# Patient Record
Sex: Male | Born: 1945
Health system: Southern US, Community
[De-identification: ages and names within clinical notes are randomized; demographics above are authoritative.]

## PROBLEM LIST (undated history)

## (undated) DIAGNOSIS — E785 Hyperlipidemia, unspecified: Secondary | ICD-10-CM

## (undated) DIAGNOSIS — K469 Unspecified abdominal hernia without obstruction or gangrene: Secondary | ICD-10-CM

## (undated) DIAGNOSIS — I2510 Atherosclerotic heart disease of native coronary artery without angina pectoris: Secondary | ICD-10-CM

## (undated) DIAGNOSIS — Z460 Encounter for fitting and adjustment of spectacles and contact lenses: Secondary | ICD-10-CM

## (undated) DIAGNOSIS — I700 Atherosclerosis of aorta: Secondary | ICD-10-CM

## (undated) HISTORY — PX: NASAL SINUS SURGERY: SHX719

## (undated) HISTORY — DX: Unspecified abdominal hernia without obstruction or gangrene: K46.9

## (undated) HISTORY — DX: Atherosclerosis of aorta: I70.0

## (undated) HISTORY — DX: Hyperlipidemia, unspecified: E78.5

## (undated) HISTORY — DX: Encounter for fitting and adjustment of spectacles and contact lenses: Z46.0

## (undated) HISTORY — PX: MOLE REMOVAL: SHX2046

## (undated) HISTORY — PX: OTHER SURGICAL HISTORY: SHX169

## (undated) HISTORY — DX: Atherosclerotic heart disease of native coronary artery without angina pectoris: I25.10

---

## 1998-10-07 ENCOUNTER — Ambulatory Visit (HOSPITAL_COMMUNITY): Admission: RE | Admit: 1998-10-07 | Discharge: 1998-10-07 | Payer: Self-pay | Admitting: *Deleted

## 1998-10-08 ENCOUNTER — Ambulatory Visit (HOSPITAL_COMMUNITY): Admission: RE | Admit: 1998-10-08 | Discharge: 1998-10-08 | Payer: Self-pay | Admitting: *Deleted

## 1999-10-08 ENCOUNTER — Emergency Department (HOSPITAL_COMMUNITY): Admission: EM | Admit: 1999-10-08 | Discharge: 1999-10-08 | Payer: Self-pay | Admitting: Emergency Medicine

## 1999-10-08 ENCOUNTER — Encounter: Payer: Self-pay | Admitting: Surgery

## 2003-03-25 ENCOUNTER — Encounter: Admission: RE | Admit: 2003-03-25 | Discharge: 2003-03-25 | Payer: Self-pay | Admitting: Diagnostic Radiology

## 2011-03-13 ENCOUNTER — Ambulatory Visit (INDEPENDENT_AMBULATORY_CARE_PROVIDER_SITE_OTHER): Payer: BC Managed Care – PPO | Admitting: General Surgery

## 2011-03-13 ENCOUNTER — Encounter (INDEPENDENT_AMBULATORY_CARE_PROVIDER_SITE_OTHER): Payer: Self-pay | Admitting: General Surgery

## 2011-03-13 VITALS — BP 134/72 | HR 70 | Temp 96.4°F | Ht 73.5 in | Wt 186.4 lb

## 2011-03-13 DIAGNOSIS — K4020 Bilateral inguinal hernia, without obstruction or gangrene, not specified as recurrent: Secondary | ICD-10-CM

## 2011-03-13 NOTE — Patient Instructions (Signed)
You have a large, reducible right inguinal hernia, and a very small left inguinal hernia. You have a small left hydrocele. We have discussed several different ways to handle these problems. We have decided to schedule you for laparoscopic repair of your bilateral inguinal hernias using mesh. We will delay repair of the left hydrocele indefinitely, unless it becomes symptomatic.

## 2011-03-13 NOTE — Progress Notes (Signed)
Subjective:     Patient ID: Mitchell Villegas, male   DOB: 1946-07-02, 65 y.o.   MRN: [id number]  HPI This patient is the husband of Mitchell Villegas, a former nurse in our clinic. He is self referred for evaluation of a large right inguinal hernia. He thinks he has a small left renal hernia and a small left hydrocele.  He states that he has had a bulge in his right groin for 5 years. It is been getting larger and is somewhat painful. He's had a couple of episodes where he had forcibly pushed it back in but he has never up developing abdominal pain or nausea. He went to [facility] family practice and I told him that he also might have a small left renal hernia.  He has a small left hydrocele which has been evaluated by one of the urologists in the past. This has remained asymptomatic. He wonders if this needs to be repaired at the same time.  Past Medical History  Diagnosis Date  . Hernia   . Hyperlipidemia   . Contact lens/glasses fitting     Current Outpatient Prescriptions  Medication Sig Dispense Refill  . aspirin 81 MG tablet Take 81 mg by mouth daily.        Marland Kitchen atorvastatin (LIPITOR) 40 MG tablet Take 40 mg by mouth daily.        . Multiple Vitamin (MULTI-VITAMIN PO) Take by mouth daily.         Allergies  Allergen Reactions  . Penicillins Anaphylaxis and Hives    Family History  Problem Relation Age of Onset  . Diabetes Mother   . Heart disease Father     heart attack  . Heart disease Brother     heart attack    History  Substance Use Topics  . Smoking status: Current Everyday Smoker  . Smokeless tobacco: Not on file  . Alcohol Use: Yes     1 or 2 beers per month      Review of Systems  Constitutional: Negative.   HENT: Negative.   Eyes: Negative.   Respiratory: Negative.   Cardiovascular: Negative.   Gastrointestinal: Negative.   Genitourinary: Positive for scrotal swelling. Negative for dysuria, urgency, frequency, hematuria, flank pain, decreased urine volume,  discharge, penile swelling, enuresis, difficulty urinating, genital sores, penile pain and testicular pain.  Neurological: Negative.   Hematological: Negative.   Psychiatric/Behavioral: Negative.        Objective:   Physical Exam  Constitutional: He is oriented to person, place, and time. He appears well-developed and well-nourished. No distress.  HENT:  Head: Normocephalic and atraumatic.  Nose: Nose normal.  Mouth/Throat: No oropharyngeal exudate.  Eyes: Conjunctivae and EOM are normal. Pupils are equal, round, and reactive to light. No scleral icterus.  Neck: Normal range of motion. Neck supple. No JVD present. No tracheal deviation present. No thyromegaly present.  Cardiovascular: Normal rate, regular rhythm and normal heart sounds.   No murmur heard. Pulmonary/Chest: Effort normal and breath sounds normal. No respiratory distress. He has no wheezes. He has no rales. He exhibits no tenderness.  Abdominal: Soft. Bowel sounds are normal. He exhibits no distension and no mass. There is no tenderness. There is no rebound and no guarding.  Genitourinary: Penis normal.    No penile tenderness.  Musculoskeletal: Normal range of motion. He exhibits no edema and no tenderness.  Lymphadenopathy:    He has no cervical adenopathy.  Neurological: He is alert and oriented to person, place, and  time. He has normal reflexes. No cranial nerve deficit. He exhibits normal muscle tone. Coordination normal.  Skin: Skin is dry. No rash noted. No erythema. No pallor.  Psychiatric: He has a normal mood and affect. His behavior is normal. Judgment and thought content normal.       Assessment:      Large, reducible right inguinal hernia. This looks more like a direct hernia.   small left inguinal hernia.  Small, asymptomatic left hydrocele.  Hyperlipidemia.  Tobacco use daily.    Plan:     Talked about different approaches. He is strongly motivated towards laparoscopic repair. For this  reason we are going to go ahead and schedule him for a laparoscopic repair of his bilateral inguinal hernias. We are not going to attempt to repair the hydrocele laparoscopically. We will repair that in the future if he becomes symptomatic.  I discussed the indications and details of surgery with him. Risks and complications have been outlined, including but limited to bleeding, infection, conversion to open surgery, injury to adjacent organs such as the bladder,  intestine with major reconstructive surgery, nerve damage with chronic pain, recurrence of the hernia, cardiac pulmonary and thromboembolic problems. He seems to understand these issues well. At this time all of his questions were answered. He is in full agreement with this plan.

## 2011-04-10 HISTORY — PX: HERNIA REPAIR: SHX51

## 2011-05-29 ENCOUNTER — Other Ambulatory Visit (INDEPENDENT_AMBULATORY_CARE_PROVIDER_SITE_OTHER): Payer: Self-pay | Admitting: General Surgery

## 2011-05-29 ENCOUNTER — Telehealth (INDEPENDENT_AMBULATORY_CARE_PROVIDER_SITE_OTHER): Payer: Self-pay

## 2011-05-29 ENCOUNTER — Encounter (HOSPITAL_COMMUNITY): Payer: BC Managed Care – PPO

## 2011-05-29 ENCOUNTER — Ambulatory Visit (HOSPITAL_COMMUNITY)
Admission: RE | Admit: 2011-05-29 | Discharge: 2011-05-29 | Disposition: A | Discharge status: Home / Self Care | Payer: BC Managed Care – PPO | Source: Ambulatory Visit | Attending: General Surgery | Admitting: General Surgery

## 2011-05-29 DIAGNOSIS — Z01810 Encounter for preprocedural cardiovascular examination: Secondary | ICD-10-CM | POA: Insufficient documentation

## 2011-05-29 DIAGNOSIS — R05 Cough: Secondary | ICD-10-CM | POA: Insufficient documentation

## 2011-05-29 DIAGNOSIS — Z01818 Encounter for other preprocedural examination: Secondary | ICD-10-CM | POA: Insufficient documentation

## 2011-05-29 DIAGNOSIS — R059 Cough, unspecified: Secondary | ICD-10-CM | POA: Insufficient documentation

## 2011-05-29 DIAGNOSIS — Z01812 Encounter for preprocedural laboratory examination: Secondary | ICD-10-CM | POA: Insufficient documentation

## 2011-05-29 DIAGNOSIS — Z87891 Personal history of nicotine dependence: Secondary | ICD-10-CM | POA: Insufficient documentation

## 2011-05-29 LAB — DIFFERENTIAL
Basophils Absolute: 0.1 10*3/uL (ref 0.0–0.1)
Basophils Relative: 1 % (ref 0–1)
Eosinophils Absolute: 0.3 10*3/uL (ref 0.0–0.7)
Eosinophils Relative: 4 % (ref 0–5)
Lymphocytes Relative: 35 % (ref 12–46)
Lymphs Abs: 2.9 10*3/uL (ref 0.7–4.0)
Monocytes Absolute: 0.6 10*3/uL (ref 0.1–1.0)
Monocytes Relative: 8 % (ref 3–12)
Neutro Abs: 4.3 10*3/uL (ref 1.7–7.7)
Neutrophils Relative %: 53 % (ref 43–77)

## 2011-05-29 LAB — SURGICAL PCR SCREEN
MRSA, PCR: NEGATIVE
Staphylococcus aureus: NEGATIVE

## 2011-05-29 LAB — COMPREHENSIVE METABOLIC PANEL
AST: 19 U/L (ref 0–37)
Albumin: 4.1 g/dL (ref 3.5–5.2)
Alkaline Phosphatase: 53 U/L (ref 39–117)
Chloride: 100 mEq/L (ref 96–112)
Potassium: 3.9 mEq/L (ref 3.5–5.1)
Sodium: 138 mEq/L (ref 135–145)
Total Bilirubin: 0.3 mg/dL (ref 0.3–1.2)
Total Protein: 7.6 g/dL (ref 6.0–8.3)

## 2011-05-29 LAB — CBC
HCT: 47.2 % (ref 39.0–52.0)
Hemoglobin: 16.1 g/dL (ref 13.0–17.0)
MCH: 30.7 pg (ref 26.0–34.0)
MCHC: 34.1 g/dL (ref 30.0–36.0)
MCV: 89.9 fL (ref 78.0–100.0)

## 2011-05-29 LAB — URINALYSIS, ROUTINE W REFLEX MICROSCOPIC
Bilirubin Urine: NEGATIVE
Glucose, UA: NEGATIVE mg/dL
Hgb urine dipstick: NEGATIVE
Ketones, ur: NEGATIVE mg/dL
Protein, ur: NEGATIVE mg/dL

## 2011-05-29 NOTE — Telephone Encounter (Signed)
Received msg from Pat at pre admit re:pt allergy to penicillin. Pre op antibiotic changed to vancomycin and faxed back to [facility] per Dr Jacinto Halim order.

## 2011-06-02 ENCOUNTER — Ambulatory Visit (HOSPITAL_COMMUNITY)
Admission: RE | Admit: 2011-06-02 | Discharge: 2011-06-02 | Disposition: A | Discharge status: Home / Self Care | Payer: BC Managed Care – PPO | Source: Ambulatory Visit | Attending: General Surgery | Admitting: General Surgery

## 2011-06-02 ENCOUNTER — Telehealth (INDEPENDENT_AMBULATORY_CARE_PROVIDER_SITE_OTHER): Payer: Self-pay | Admitting: General Surgery

## 2011-06-02 DIAGNOSIS — F17200 Nicotine dependence, unspecified, uncomplicated: Secondary | ICD-10-CM | POA: Insufficient documentation

## 2011-06-02 DIAGNOSIS — K4020 Bilateral inguinal hernia, without obstruction or gangrene, not specified as recurrent: Secondary | ICD-10-CM

## 2011-06-02 DIAGNOSIS — Z01818 Encounter for other preprocedural examination: Secondary | ICD-10-CM | POA: Insufficient documentation

## 2011-06-02 DIAGNOSIS — R05 Cough: Secondary | ICD-10-CM | POA: Insufficient documentation

## 2011-06-02 DIAGNOSIS — Z01812 Encounter for preprocedural laboratory examination: Secondary | ICD-10-CM | POA: Insufficient documentation

## 2011-06-02 DIAGNOSIS — Z01810 Encounter for preprocedural cardiovascular examination: Secondary | ICD-10-CM | POA: Insufficient documentation

## 2011-06-02 DIAGNOSIS — R059 Cough, unspecified: Secondary | ICD-10-CM | POA: Insufficient documentation

## 2011-06-02 NOTE — Telephone Encounter (Signed)
Patient needs a 2wk po after sx on 06/02/11, prefers any day after 11:30am if possible, please call.

## 2011-06-02 NOTE — Telephone Encounter (Signed)
Pt home doing well. Po appt made. Pt to call with concerns.

## 2011-06-04 NOTE — Op Note (Signed)
NAMEMarland Kitchen  GEROD, CALIGIURI NO.:  [id number]  MEDICAL RECORD NO.:  [id number]  LOCATION:  DAYL                         FACILITY:  [facility] [place]  PHYSICIAN:  Angelia Mould. Derrell Lolling, M.D.DATE OF BIRTH:  1945-10-04  DATE OF PROCEDURE: DATE OF DISCHARGE:                              OPERATIVE REPORT   PREOPERATIVE DIAGNOSIS:  Bilateral inguinal hernias.  POSTOPERATIVE DIAGNOSIS:  Bilateral inguinal hernias.  OPERATION PERFORMED:  Laparoscopic, preperitoneal repair of bilateral inguinal hernias with Ultrapro mesh.  SURGEON:  Angelia Mould. Derrell Lolling, M.D.  OPERATIVE INDICATION:  This is a 65 year old gentleman in reasonably good health.  He has had a bulge in his right groin for about 5 years. It is getting larger and becoming painful.  He has to forcibly push this back in a couple of times.  On exam, he has a large right inguinal hernia, which is reducible and a small left inguinal hernia.  We discussed options for intervention and he was strongly motivated towards having a laparoscopic repair.  He was brought to the operating room electively.  OPERATIVE FINDINGS:  The patient had a large direct right inguinal hernia and a smaller direct left inguinal hernia.  I do not think he had any evidence of indirect hernia on either side.  OPERATIVE TECHNIQUE:  Following the induction of general endotracheal anesthesia, a Foley catheter was inserted, the bladder was emptied, the balloon was deflated, and the Foley left in place.  The abdomen and genitalia were prepped and draped in a sterile fashion.  Intravenous antibiotics were given.  The patient was identified as correct patient, correct procedure, and correct site with a surgical time-out.  Marcaine 0.5% with epinephrine was used as a local infiltration anesthetic.  A transverse incision was made just below the umbilicus.  The fascia was incised transversely exposing the right rectus muscle.  The Spacemaker balloon was inserted in the  right rectus sheath down to just above the symphysis pubis.  Video camera was inserted.  The dissector balloon was inflated with air under direct vision and held in place for 5 minutes. We had good visualization of the rectus muscles anteriorly, preperitoneal fat posteriorly, symphysis pubis, and Cooper's ligaments inferiorly.  This camera was removed.  The balloon was deflated and removed.  The trocar balloon was inflated and secured.  The trocar was then connected to the insufflator at 13 mmHg.  We re-expanded the preperitoneal space and reinserted the camera.  We had good visualization and minimal bleeding.  A 5-mm trocar was placed in the midline just below the umbilicus.  I used this to clean off the fascia on the right and left side, pulling the peritoneum down from the transversus abdominis laterally.  The inferior epigastric vessels on the right side had been pulled down by the balloon and were interfering with visualization and so they were clipped and divided.  We then completed the dissection of the peritoneum down off the transversus abdominis muscles bilaterally.  We placed 5-mm trocars on the right lower quadrant and left lower quadrant.  I then dissected the right side, dissecting the large direct hernia.  I dissected the pseudosac away from the preperitoneal fat and allowed it to  retract.  I actually pulled it back out and tacked it to the superior rim of the defect with a secure strap device to hoping to reduce seroma formation.  We then dissected the cord structures on the right side. There were some peritoneum up on the cord, but actually not going into the internal ring.  We dissected that back.  We identified the vas deferens and testicular vessels.  I felt the dissection was completed on the right side.  I then dissected the left side in similar fashion. There was a small direct hernia on this side, but had no incarcerated contents.  I then dissected the cord  structures on the left side, cleaning them off somewhat to be sure there was no indirect hernia and there was not.  I repaired the hernias bilaterally with a 3 x 6 inch piece of Ultrapro mesh on each side.  The meshes were placed so as to slightly overlap in the midline, then were secured up the midline with the secure strap. The meshes were positioned so as to overlap Cooper's ligament, and I placed about 3 secure strap fixation devices above the Cooper's ligament on each side.  We then completed the mesh fixation laterally, being sure to palpate the tacker through the abdominal wall to be sure that we stayed above the iliopubic tract.  A couple of other tacks were placed along the superior rim of the mesh.  Inspection of the repair looked good.  The mesh was deployed well laterally and covered the defects quite nicely in the usual fashion.  There was no bleeding.  There was somewhat of a pneumoperitoneum itself placed the Veress needle in the right upper quadrant and evacuated the pneumoperitoneum.  At this point, we disconnected the insufflation and removed all the trocars.  We closed the fascia at the umbilicus with 2 interrupted figure-of-eight sutures of 0 Vicryl.  The skin incisions were closed with subcuticular sutures of 4-0 Monocryl and Dermabond.  The patient tolerated the procedure well and was taken to recovery room in stable condition.  ESTIMATED BLOOD LOSS:  About 10-20 cc.  COMPLICATIONS:  None.  Sponge, needle, and instrument counts were correct.     Angelia Mould. Derrell Lolling, M.D.     HMI/MEDQ  D:  06/02/2011  T:  06/02/2011  Job:  [id number]  cc:   Tammy R. Collins [place], M.D. Fax: [phone]  Electronically Signed by Claud Kelp M.D. on 06/04/2011 08:17:40 PM

## 2011-06-16 ENCOUNTER — Ambulatory Visit (INDEPENDENT_AMBULATORY_CARE_PROVIDER_SITE_OTHER): Payer: BC Managed Care – PPO | Admitting: General Surgery

## 2011-06-16 ENCOUNTER — Encounter (INDEPENDENT_AMBULATORY_CARE_PROVIDER_SITE_OTHER): Payer: BC Managed Care – PPO | Admitting: General Surgery

## 2011-06-16 ENCOUNTER — Encounter (INDEPENDENT_AMBULATORY_CARE_PROVIDER_SITE_OTHER): Payer: Self-pay | Admitting: General Surgery

## 2011-06-16 VITALS — BP 136/72 | HR 64 | Temp 96.8°F | Resp 14 | Ht 74.0 in | Wt 184.4 lb

## 2011-06-16 DIAGNOSIS — Z9889 Other specified postprocedural states: Secondary | ICD-10-CM

## 2011-06-16 NOTE — Progress Notes (Signed)
Subjective:     Patient ID: Hinda Glatter, male   DOB: [birth year], 65 y.o.   MRN: [id number]  HPI  Andersen underwent laparoscopic repair of bilateral inguinal hernias with mesh on November 2. He had minimal bruising but he was sore for about a week. That has now resolved and he feels well without any problems.Review of Systems     Objective:   Physical Exam Patient looks well and is in no distress.  The umbilical incisions, the trocar incisions, and both groins looked normal. There is no sign of any infection. There is no seroma or hematoma.The scrotum and testes are normal. He does have a spermatocele in the left scrotum as we knew about.    Assessment:     Bilateral inguinal hernias, uneventful recovery following laparoscopic repair with mesh.    Plan:     He is advised to take daily walks but to restrict himself to less than 20 pounds lifting and no sports until after December 2.  Return to see me p.r.n.

## 2011-06-16 NOTE — Patient Instructions (Signed)
Your wounds are healing very well. I advise you to restrict yourself to less than 20 pounds lifting, no sports or strenuous activities until after December 2. No restrictions thereafter. Please take a nice walk  every day. Return to see me if there are further problems.

## 2015-11-15 DIAGNOSIS — Z79899 Other long term (current) drug therapy: Secondary | ICD-10-CM | POA: Diagnosis not present

## 2016-01-05 DIAGNOSIS — R002 Palpitations: Secondary | ICD-10-CM | POA: Diagnosis not present

## 2016-01-05 DIAGNOSIS — J019 Acute sinusitis, unspecified: Secondary | ICD-10-CM | POA: Diagnosis not present

## 2016-01-22 ENCOUNTER — Emergency Department (HOSPITAL_COMMUNITY)
Admission: EM | Admit: 2016-01-22 | Discharge: 2016-01-22 | Disposition: A | Discharge status: Home / Self Care | Payer: Medicare Other | Attending: Emergency Medicine | Admitting: Emergency Medicine

## 2016-01-22 ENCOUNTER — Emergency Department (HOSPITAL_COMMUNITY): Payer: Medicare Other

## 2016-01-22 ENCOUNTER — Encounter (HOSPITAL_COMMUNITY): Payer: Self-pay | Admitting: Emergency Medicine

## 2016-01-22 ENCOUNTER — Other Ambulatory Visit: Payer: Self-pay

## 2016-01-22 DIAGNOSIS — E785 Hyperlipidemia, unspecified: Secondary | ICD-10-CM | POA: Diagnosis not present

## 2016-01-22 DIAGNOSIS — R002 Palpitations: Secondary | ICD-10-CM | POA: Diagnosis not present

## 2016-01-22 DIAGNOSIS — Z7982 Long term (current) use of aspirin: Secondary | ICD-10-CM | POA: Insufficient documentation

## 2016-01-22 DIAGNOSIS — F17200 Nicotine dependence, unspecified, uncomplicated: Secondary | ICD-10-CM | POA: Diagnosis not present

## 2016-01-22 DIAGNOSIS — Z79899 Other long term (current) drug therapy: Secondary | ICD-10-CM | POA: Diagnosis not present

## 2016-01-22 LAB — BASIC METABOLIC PANEL
Anion gap: 7 (ref 5–15)
BUN: 16 mg/dL (ref 6–20)
CO2: 26 mmol/L (ref 22–32)
Calcium: 9.3 mg/dL (ref 8.9–10.3)
Chloride: 105 mmol/L (ref 101–111)
Creatinine, Ser: 0.86 mg/dL (ref 0.61–1.24)
GFR calc Af Amer: >60 mL/min (ref >60)
GFR calc non Af Amer: >60 mL/min (ref >60)
Glucose, Bld: 99 mg/dL (ref 65–99)
Potassium: 3.9 mmol/L (ref 3.5–5.1)
Sodium: 138 mmol/L (ref 135–145)

## 2016-01-22 LAB — CBC
HCT: 46.6 % (ref 39.0–52.0)
Hemoglobin: 15.6 g/dL (ref 13.0–17.0)
MCH: 30.1 pg (ref 26.0–34.0)
MCHC: 33.5 g/dL (ref 30.0–36.0)
MCV: 89.8 fL (ref 78.0–100.0)
Platelets: 205 10*3/uL (ref 150–400)
RBC: 5.19 MIL/uL (ref 4.22–5.81)
RDW: 13.1 % (ref 11.5–15.5)
WBC: 8.4 10*3/uL (ref 4.0–10.5)

## 2016-01-22 LAB — I-STAT TROPONIN, ED: Troponin i, poc: 0 ng/mL (ref 0.00–0.08)

## 2016-01-22 NOTE — ED Notes (Signed)
Pt. Stated, I can feel my heart off and on heart beating fast. It started after i finished working outside.

## 2016-01-22 NOTE — Discharge Instructions (Signed)

## 2016-01-22 NOTE — ED Provider Notes (Signed)
CSN: [id number]     Arrival date & time 01/22/16  1442 History   First MD Initiated Contact with Patient 01/22/16 1610     Chief Complaint  Patient presents with  . Palpitations     (Consider location/radiation/quality/duration/timing/severity/associated sxs/prior Treatment) HPI   70 year old male with palpitations. These have been intermittent for the past several months. They seem to come on after a period of exertion. He will do things such as yard work and feel fine. Shortly after stopping though he will have episodes where he feels like his heart is racing and beating irregular. His previously sought evaluation for this, but symptoms resolve by the time he was able to see anybody. No specific dysrhythmia has been noted at this point. He currently has no complaints. He denies any other associated symptoms with these palpitations as chest pain or shortness of breath. Denies any systemic caffeine usage. No recent medication changes. Denies any over-the-counter medicines or supplements that are new either.  Past Medical History  Diagnosis Date  . Hernia   . Hyperlipidemia   . Contact lens/glasses fitting    Past Surgical History  Procedure Laterality Date  . Arm surgery      Stitches for wound (cut) two places. 8 to 10 years ago.  . Mole removal      about 12 years ago  . Hernia repair  04/10/11    bilateral inguinal   Family History  Problem Relation Age of Onset  . Diabetes Mother   . Heart disease Father     heart attack  . Heart disease Brother     heart attack   Social History  Substance Use Topics  . Smoking status: Current Every Day Smoker  . Smokeless tobacco: Never Used  . Alcohol Use: Yes     Comment: 1 or 2 beers per month    Review of Systems  All systems reviewed and negative, other than as noted in HPI.   Allergies  Penicillins  Home Medications   Prior to Admission medications   Medication Sig Start Date End Date Taking? Authorizing Provider   aspirin 81 MG tablet Take 324 mg by mouth daily.    Yes Historical Provider, MD  atorvastatin (LIPITOR) 40 MG tablet Take 40 mg by mouth daily.     Yes Historical Provider, MD  loratadine (CLARITIN) 10 MG tablet Take 10 mg by mouth daily.   Yes Historical Provider, MD  Multiple Vitamin (MULTI-VITAMIN PO) Take by mouth daily.     Yes Historical Provider, MD  acetaminophen (TYLENOL) 500 MG chewable tablet Chew 500 mg by mouth as needed.      Historical Provider, MD  HYDROcodone-acetaminophen (NORCO) 5-325 MG per tablet Take 1 tablet by mouth as needed.      Historical Provider, MD   BP 130/66 mmHg  Pulse 67  Temp(Src) 98.2 F (36.8 C)  Resp 14  Ht 6\' 2"  (1.88 m)  Wt 178 lb (80.74 kg)  BMI 22.84 kg/m2  SpO2 97% Physical Exam  Constitutional: He appears well-developed and well-nourished. No distress.  HENT:  Head: Normocephalic and atraumatic.  Eyes: Conjunctivae are normal. Right eye exhibits no discharge. Left eye exhibits no discharge.  Neck: Neck supple.  Cardiovascular: Normal rate, regular rhythm and normal heart sounds.  Exam reveals no gallop and no friction rub.   No murmur heard. Pulmonary/Chest: Effort normal and breath sounds normal. No respiratory distress.  Abdominal: Soft. He exhibits no distension. There is no tenderness.  Musculoskeletal: He exhibits  no edema or tenderness.  Neurological: He is alert.  Skin: Skin is warm and dry.  Psychiatric: He has a normal mood and affect. His behavior is normal. Thought content normal.  Nursing note and vitals reviewed.   ED Course  Procedures (including critical care time) Labs Review Labs Reviewed  BASIC METABOLIC PANEL  CBC  I-STAT TROPOININ, ED    Imaging Review Dg Chest 2 View  01/22/2016  CLINICAL DATA:  Palpitations EXAM: CHEST  2 VIEW COMPARISON:  05/29/2011 chest radiograph. FINDINGS: Stable cardiomediastinal silhouette with normal heart size. No pneumothorax. No pleural effusion. Lungs appear clear, with no  acute consolidative airspace disease and no pulmonary edema. IMPRESSION: No active cardiopulmonary disease. Electronically Signed   By: Delbert PhenixJason A Poff M.D.   On: 01/22/2016 15:18   I have personally reviewed and evaluated these images and lab results as part of my medical decision-making.   EKG Interpretation   Date/Time:  Saturday January 22 2016 14:45:40 EDT Ventricular Rate:  93 PR Interval:  156 QRS Duration: 94 QT Interval:  362 QTC Calculation: 450 R Axis:   87 Text Interpretation:  Normal sinus rhythm Normal ECG Confirmed by Juleen ChinaKOHUT   MD, Kaimani Clayson ([id number]) on 01/22/2016 4:10:36 PM      MDM   Final diagnoses:  Heart palpitations    70 year old male with palpitations. These have been ongoing and intermittent since the beginning of the year. By the time he has sought evaluation and he has been asymptomatic and underlying dysrhythmia, if any, has not been identified. Currently has no complaints. His EKG shows a sinus rhythm. He is hemodynamically stable. Basic labs unremarkable. He reports upcoming cardiology appointment. May benefit from extended monitoring.    Raeford RazorStephen Rogelio Waynick, MD 01/27/16 1236

## 2016-01-22 NOTE — ED Notes (Signed)
MD at bedside.Kohut  

## 2016-01-28 ENCOUNTER — Ambulatory Visit (INDEPENDENT_AMBULATORY_CARE_PROVIDER_SITE_OTHER): Payer: Medicare Other | Admitting: Cardiovascular Disease

## 2016-01-28 ENCOUNTER — Encounter: Payer: Self-pay | Admitting: Cardiovascular Disease

## 2016-01-28 DIAGNOSIS — R002 Palpitations: Secondary | ICD-10-CM | POA: Diagnosis not present

## 2016-01-28 DIAGNOSIS — E785 Hyperlipidemia, unspecified: Secondary | ICD-10-CM | POA: Insufficient documentation

## 2016-01-28 NOTE — Patient Instructions (Signed)
Medication Instructions:  Your physician recommends that you continue on your current medications as directed. Please refer to the Current Medication list given to you today.   Labwork: NONE  Testing/Procedures: Your physician has requested that you have an echocardiogram. Echocardiography is a painless test that uses sound waves to create images of your heart. It provides your doctor with information about the size and shape of your heart and how well your heart's chambers and valves are working. This procedure takes approximately one hour. There are no restrictions for this procedure.  Your physician has requested that you have en exercise stress myoview. For further information please visit [email or web address]. Please follow instruction sheet, as given.  Your physician has recommended that you wear an event monitor. Event monitors are medical devices that record the heart's electrical activity. Doctors most often us these monitors to diagnose arrhythmias. Arrhythmias are problems with the speed or rhythm of the heartbeat. The monitor is a small, portable device. You can wear one while you do your normal daily activities. This is usually used to diagnose what is causing palpitations/syncope (passing out).  30 DAY EVENT. PATIENT WILL HAVE THIS PLACED IN July AFTER VACATION. WILL NEED APPT TO DO SO.    Follow-Up: Your physician recommends that you schedule a follow-up appointment in: 2 MONTHS WITH DR Allyson SabalBERRY.   Exercise Stress Echocardiogram An exercise stress echocardiogram is a heart (cardiac) test used to check the function of your heart. This test may also be called an exercise stress echocardiography or stress echo. This stress test will check how well your heart muscle and valves are working and determine if your heart muscle is getting enough blood. You will exercise on a treadmill to naturally increase or stress the functioning of your heart.  An echocardiogram uses sound waves  (ultrasound) to produce an image of your heart. If your heart does not work normally, it may indicate coronary artery disease with poor coronary blood supply. The coronary arteries are the arteries that bring blood and oxygen to your heart. LET [facility] PROVIDER KNOW ABOUT:  Any allergies you have.  All medicines you are taking, including vitamins, herbs, eye drops, creams, and over-the-counter medicines.  Previous problems you or members of your family have had with the use of anesthetics.  Any blood disorders you have.  Previous surgeries you have had.  Medical conditions you have.  Possibility of pregnancy, if this applies. RISKS AND COMPLICATIONS Generally, this is a safe procedure. However, as with any procedure, complications can occur. Possible complications can include:  You develop pain or pressure in the following areas:  Chest.  Jaw or neck.  Between your shoulder blades.  Radiating down your left arm.  Dizziness or lightheadedness.  Shortness of breath.  Increased or irregular heartbeat.  Nausea or vomiting.  Heart attack (rare). BEFORE THE PROCEDURE  Avoid all forms of caffeine for 24 hours before your test or as directed by your health care provider. This includes coffee, tea (even decaffeinated tea), caffeinated sodas, chocolate, cocoa, and certain pain medicines.  Follow your health care provider's instructions regarding eating and drinking before the test.  Take your medicines as directed at regular times with water unless instructed otherwise. Exceptions may include:  If you have diabetes, ask how you are to take your insulin or pills. It is common to adjust insulin dosing the morning of the test.  If you are taking beta-blocker medicines, it is important to talk to your health care provider about these  medicines well before the date of your test. Taking beta-blocker medicines may interfere with the test. In some cases, these medicines need to  be changed or stopped 24 hours or more before the test.  If you wear a nitroglycerin patch, it may need to be removed prior to the test. Ask your health care provider if the patch should be removed before the test.  If you use an inhaler for any breathing condition, bring it with you to the test.  If you are an outpatient, bring a snack so you can eat right after the stress phase of the test.  Do not smoke for 4 hours prior to the test or as directed by your health care provider.  Wear loose-fitting clothes and comfortable shoes for the test. This test involves walking on a treadmill. PROCEDURE   Multiple electrodes will be put on your chest. If needed, small areas of your chest may be shaved to get better contact with the electrodes. Once the electrodes are attached to your body, multiple wires will be attached to the electrodes, and your heart rate will be monitored.  You will have an echocardiogram done at rest.  To produce this image of your heart, gel is applied to your chest, and a wand-like tool (transducer) is moved over the chest. The transducer sends the sound waves through the chest to create the moving images of your heart.  You may need an IV to receive a medication that improves the quality of the pictures.  You will then walk on a treadmill. The treadmill will be started at a slow pace. The treadmill speed and incline will gradually be increased to raise your heart rate.  At the peak of exercise, the treadmill will be stopped. You will lie down immediately on a bed so that a second echocardiogram can be done to visualize your heart's motion with exercise.  The test usually takes 30-60 minutes to complete. AFTER THE PROCEDURE  Your heart rate and blood pressure will be monitored after the test.  You may return to your normal schedule, including diet, activities, and medicines, unless your health care provider tells you otherwise.   This information is not intended to  replace advice given to you by your health care provider. Make sure you discuss any questions you have with your health care provider.   Document Released: 07/21/2004 Document Revised: 07/22/2013 Document Reviewed: 03/24/2013 Elsevier Interactive Patient Education [employer]2016 Elsevier Inc.     If you need a refill on your cardiac medications before your next appointment, please call your pharmacy.

## 2016-01-28 NOTE — Progress Notes (Signed)
01/28/2016 Mitchell Villegas L Villegas   12/09/1945  [id number]  Primary Physician Mitchell Villegas,Mitchell KELLER, MD Primary Cardiologist: Runell GessJonathan J Daizee Firmin MD Mitchell RenoFACP, FACC, FAHA, FSCAI  HPI:  Mr. Mitchell Villegas is a 70 year old thin appearing very Caucasian male father of 2 children accompanied by his wife Mitchell Villegas today who is a retired Engineer, civil (consulting)nurse. He was formally a patient of Dr. Molly Maduroobert Villegas's side 10 years ago. He is referred for evaluation of new onset tachypalpitations. His cardiovascular risk factor profile is notable for 50-pack-years of tobacco abuse currently smoking one pack per day and recalcitrant to risk factor modification. History of hyperlipidemia as well as family history the brother who died of a myocardial infarction at age 71. He did have a cardiac catheterization 02/17/92 by Dr. Mindi JunkerAl Villegas which was normal. He had a normal 2-D echo and Myoview stress test performed by Dr. Jenne Villegas 04/11/16. He denies chest pain or shortness of breath. His father died in January of this year. He's had 6 episodes of tachycardia palpitations since usually after significant exertion.   Current Outpatient Prescriptions  Medication Sig Dispense Refill  . acetaminophen (TYLENOL) 500 MG chewable tablet Chew 500 mg by mouth as needed.      Marland Kitchen. aspirin 81 MG tablet Take 324 mg by mouth daily.     Marland Kitchen. atorvastatin (LIPITOR) 40 MG tablet Take 40 mg by mouth daily.      Marland Kitchen. loratadine (CLARITIN) 10 MG tablet Take 10 mg by mouth daily.    . Multiple Vitamin (MULTI-VITAMIN PO) Take by mouth daily.       No current facility-administered medications for this visit.    Allergies  Allergen Reactions  . Penicillins Anaphylaxis and Hives    Social History   Social History  . Marital Status: Married    Spouse Name: N/A  . Number of Children: N/A  . Years of Education: N/A   Occupational History  . Not on file.   Social History Main Topics  . Smoking status: Current Every Day Smoker  . Smokeless tobacco: Never Used  . Alcohol Use: Yes      Comment: 1 or 2 beers per month  . Drug Use: No  . Sexual Activity: Not on file   Other Topics Concern  . Not on file   Social History Narrative     Review of Systems: General: negative for chills, fever, night sweats or weight changes.  Cardiovascular: negative for chest pain, dyspnea on exertion, edema, orthopnea, palpitations, paroxysmal nocturnal dyspnea or shortness of breath Dermatological: negative for rash Respiratory: negative for cough or wheezing Urologic: negative for hematuria Abdominal: negative for nausea, vomiting, diarrhea, bright red blood per rectum, melena, or hematemesis Neurologic: negative for visual changes, syncope, or dizziness All other systems reviewed and are otherwise negative except as noted above.    Blood pressure 126/72, pulse 61, height 6\' 2"  (1.88 m), weight 176 lb (79.833 kg).  General appearance: alert and no distress Neck: no adenopathy, no carotid bruit, no JVD, supple, symmetrical, trachea midline and thyroid not enlarged, symmetric, no tenderness/mass/nodules Lungs: clear to auscultation bilaterally Heart: regular rate and rhythm, S1, S2 normal, no murmur, click, rub or gallop Extremities: extremities normal, atraumatic, no cyanosis or edema  EKG sinus rhythm of 61 without ST or T-wave changes. I personally reviewed this EKG  ASSESSMENT AND PLAN:   Hyperlipidemia History of hyperlipidemia on statin therapy followed by his PCP  Palpitations Mr. Mitchell Villegas is being seen today for evaluation of new onset palpitations. He's had these  beginning in January of this year and has had approximately 6 episodes. He recently went to the emergency room 01/22/16 because of this and bilateral knee had subsided. His EKG there showed sinus rhythm. He cut out caffeine back in January. The palpitations usually start after exertion. There is are no other associated symptoms. He had laboratory performed by his PCP that showed normal lites and normal thyroid  functions. I'm going to get a 2-D echocardiogram, exercise Myoview stress test and a 30 day event monitor.      Runell GessJonathan J. Isaia Hassell MD FACP,FACC,FAHA, [facility] 01/28/2016 11:32 AM

## 2016-01-28 NOTE — Assessment & Plan Note (Signed)
History of hyperlipidemia on statin therapy followed by his PCP 

## 2016-01-28 NOTE — Assessment & Plan Note (Signed)
Mr. Mitchell Villegas is being seen today for evaluation of new onset palpitations. He's had these beginning in January of this year and has had approximately 6 episodes. He recently went to the emergency room 01/22/16 because of this and bilateral knee had subsided. His EKG there showed sinus rhythm. He cut out caffeine back in January. The palpitations usually start after exertion. There is are no other associated symptoms. He had laboratory performed by his PCP that showed normal lites and normal thyroid functions. I'm going to get a 2-D echocardiogram, exercise Myoview stress test and a 30 day event monitor.

## 2016-02-15 ENCOUNTER — Telehealth (HOSPITAL_COMMUNITY): Payer: Self-pay

## 2016-02-15 NOTE — Telephone Encounter (Signed)
Encounter complete. 

## 2016-02-17 ENCOUNTER — Ambulatory Visit (HOSPITAL_COMMUNITY)
Admission: RE | Admit: 2016-02-17 | Discharge: 2016-02-17 | Disposition: A | Discharge status: Home / Self Care | Payer: Medicare Other | Source: Ambulatory Visit | Attending: Cardiovascular Disease | Admitting: Cardiovascular Disease

## 2016-02-17 ENCOUNTER — Other Ambulatory Visit: Payer: Self-pay

## 2016-02-17 ENCOUNTER — Encounter (HOSPITAL_COMMUNITY): Payer: Self-pay | Admitting: *Deleted

## 2016-02-17 ENCOUNTER — Ambulatory Visit (HOSPITAL_BASED_OUTPATIENT_CLINIC_OR_DEPARTMENT_OTHER): Payer: Medicare Other

## 2016-02-17 DIAGNOSIS — Z8249 Family history of ischemic heart disease and other diseases of the circulatory system: Secondary | ICD-10-CM | POA: Diagnosis not present

## 2016-02-17 DIAGNOSIS — R9439 Abnormal result of other cardiovascular function study: Secondary | ICD-10-CM | POA: Diagnosis not present

## 2016-02-17 DIAGNOSIS — I059 Rheumatic mitral valve disease, unspecified: Secondary | ICD-10-CM | POA: Insufficient documentation

## 2016-02-17 DIAGNOSIS — R42 Dizziness and giddiness: Secondary | ICD-10-CM | POA: Diagnosis not present

## 2016-02-17 DIAGNOSIS — I313 Pericardial effusion (noninflammatory): Secondary | ICD-10-CM | POA: Insufficient documentation

## 2016-02-17 DIAGNOSIS — R002 Palpitations: Secondary | ICD-10-CM

## 2016-02-17 DIAGNOSIS — I517 Cardiomegaly: Secondary | ICD-10-CM | POA: Insufficient documentation

## 2016-02-17 DIAGNOSIS — I351 Nonrheumatic aortic (valve) insufficiency: Secondary | ICD-10-CM | POA: Insufficient documentation

## 2016-02-17 DIAGNOSIS — Z720 Tobacco use: Secondary | ICD-10-CM | POA: Diagnosis not present

## 2016-02-17 LAB — MYOCARDIAL PERFUSION IMAGING
CHL CUP NUCLEAR SDS: 0
CHL CUP NUCLEAR SRS: 2
CHL CUP NUCLEAR SSS: 2
CSEPED: 4 min
CSEPHR: 108 %
CSEPPHR: 162 {beats}/min
Estimated workload: 5.6 METS
Exercise duration (sec): 35 s
LV dias vol: 103 mL (ref 62–150)
LV sys vol: 50 mL
MPHR: 149 {beats}/min
RPE: 17
Rest HR: 57 {beats}/min
TID: 1.09

## 2016-02-17 LAB — ECHOCARDIOGRAM COMPLETE
HEIGHTINCHES: 74 in
WEIGHTICAEL: 2816 [oz_av]

## 2016-02-17 MED ORDER — TECHNETIUM TC 99M TETROFOSMIN IV KIT
10.5000 | PACK | Freq: Once | INTRAVENOUS | Status: AC | PRN
  Administered 2016-02-17: 11 via INTRAVENOUS
  Filled 2016-02-17: qty 11

## 2016-02-17 MED ORDER — TECHNETIUM TC 99M TETROFOSMIN IV KIT
30.5000 | PACK | Freq: Once | INTRAVENOUS | Status: AC | PRN
  Administered 2016-02-17: 31 via INTRAVENOUS
  Filled 2016-02-17: qty 31

## 2016-02-17 NOTE — Progress Notes (Signed)
Dr Royann Shiversroitoru reviewed Myoview study. Ok to d/c pt home.Susa LofflerPhillips, Amiri Riechers A

## 2016-02-18 ENCOUNTER — Ambulatory Visit (INDEPENDENT_AMBULATORY_CARE_PROVIDER_SITE_OTHER): Payer: Medicare Other

## 2016-02-18 DIAGNOSIS — R002 Palpitations: Secondary | ICD-10-CM

## 2016-02-29 ENCOUNTER — Telehealth: Payer: Self-pay | Admitting: *Deleted

## 2016-02-29 NOTE — Telephone Encounter (Signed)
Received incoming fax from [employer] with Serious report from 02/26/16 at 0315. Dr Allyson Sabal reviewed the report from Day 9. Dr Allyson Sabal concluded the patient had NSR with NSVT. Dr Allyson Sabal advised follow up visit to see him. Patient scheduled for 04/04/16 with Dr Allyson Sabal. Dr Allyson Sabal gave verbal ok that was fine.

## 2016-03-07 ENCOUNTER — Encounter: Payer: Self-pay | Admitting: Cardiovascular Disease

## 2016-03-17 ENCOUNTER — Telehealth: Payer: Self-pay | Admitting: *Deleted

## 2016-03-17 NOTE — Telephone Encounter (Signed)
Received fax from [employer] requesting diagnosis for monitor. Information faxed as requested

## 2016-04-04 ENCOUNTER — Ambulatory Visit (INDEPENDENT_AMBULATORY_CARE_PROVIDER_SITE_OTHER): Payer: Medicare Other | Admitting: Cardiovascular Disease

## 2016-04-04 ENCOUNTER — Encounter: Payer: Self-pay | Admitting: Cardiovascular Disease

## 2016-04-04 DIAGNOSIS — R002 Palpitations: Secondary | ICD-10-CM

## 2016-04-04 NOTE — Assessment & Plan Note (Signed)
Mr. Mitchell Villegas returns today for follow-up of patient's. Outpatient noninvasive studies showed a low risk Myoview septal and apical ischemia. 2-D echo is essentially normal and event monitor showed an episode of PSVT as well as a short episode of nonsustained ventricular tachycardia. Since I saw him last symptoms have resolved. We'll continue to monitor him  as an outpatient.

## 2016-04-04 NOTE — Patient Instructions (Signed)
Medication Instructions:  Your physician recommends that you continue on your current medications as directed. Please refer to the Current Medication list given to you today.  Follow-Up: Your physician wants you to follow-up in: 6 MONTHS WITH DR BERRY.   You will receive a reminder letter in the mail two months in advance. If you don't receive a letter, please call our office to schedule the follow-up appointment.   If you need a refill on your cardiac medications before your next appointment, please call your pharmacy.   

## 2016-04-04 NOTE — Progress Notes (Signed)
Mr. He returns today for follow-up of patient's. Outpatient noninvasive studies showed a low risk Myoview septal and apical ischemia. 2-D echo is essentially normal and event monitor showed an episode of PSVT as well as a short episode of nonsustained ventricular tachycardia. Since I saw him last symptoms have resolved. We'll continue to monitor him  as an outpatient. 

## 2016-06-13 DIAGNOSIS — Z720 Tobacco use: Secondary | ICD-10-CM | POA: Diagnosis not present

## 2016-06-13 DIAGNOSIS — Z9109 Other allergy status, other than to drugs and biological substances: Secondary | ICD-10-CM | POA: Diagnosis not present

## 2016-06-13 DIAGNOSIS — Z125 Encounter for screening for malignant neoplasm of prostate: Secondary | ICD-10-CM | POA: Diagnosis not present

## 2016-06-13 DIAGNOSIS — Z0000 Encounter for general adult medical examination without abnormal findings: Secondary | ICD-10-CM | POA: Diagnosis not present

## 2016-06-13 DIAGNOSIS — Z23 Encounter for immunization: Secondary | ICD-10-CM | POA: Diagnosis not present

## 2016-06-13 DIAGNOSIS — R002 Palpitations: Secondary | ICD-10-CM | POA: Diagnosis not present

## 2016-06-13 DIAGNOSIS — Z1211 Encounter for screening for malignant neoplasm of colon: Secondary | ICD-10-CM | POA: Diagnosis not present

## 2016-06-13 DIAGNOSIS — E785 Hyperlipidemia, unspecified: Secondary | ICD-10-CM | POA: Diagnosis not present

## 2016-10-03 ENCOUNTER — Ambulatory Visit (INDEPENDENT_AMBULATORY_CARE_PROVIDER_SITE_OTHER): Payer: Medicare Other | Admitting: Cardiovascular Disease

## 2016-10-03 ENCOUNTER — Encounter: Payer: Self-pay | Admitting: Cardiovascular Disease

## 2016-10-03 VITALS — BP 122/72 | HR 56 | Ht 74.0 in | Wt 183.0 lb

## 2016-10-03 DIAGNOSIS — R002 Palpitations: Secondary | ICD-10-CM

## 2016-10-03 DIAGNOSIS — E7800 Pure hypercholesterolemia, unspecified: Secondary | ICD-10-CM | POA: Diagnosis not present

## 2016-10-03 NOTE — Assessment & Plan Note (Signed)
History of palpitations with event monitor performed in September of last year that showed sinus rhythm, sinus tachycardia, PSVT and nonsustained VT. This was in the setting of a stressful event the loss of his father. Since that time he's had no further palpitations

## 2016-10-03 NOTE — Assessment & Plan Note (Signed)
History of hyperlipidemia on statin therapy followed by his PCP 

## 2016-10-03 NOTE — Patient Instructions (Signed)
Medication Instructions: Your physician recommends that you continue on your current medications as directed. Please refer to the Current Medication list given to you today.   Follow-Up: Your physician recommends that you schedule a follow-up appointment as needed with Dr. Berry.    

## 2016-10-03 NOTE — Progress Notes (Signed)
10/03/2016 Mitchell Villegas   1946/05/23  [id number]  Primary Physician Irving Copas, MD Primary Cardiologist: Runell Gess MD Roseanne Reno  HPI:  Mr. Mitchell Villegas is a 71 year old thin appearing very Caucasian male father of 2 children accompanied by his wife Mitchell Villegas today who is a retired Engineer, civil (consulting). He was formally a patient of Dr. Molly Maduro McQueen's. I last saw him in the office 04/04/16. He is referred for evaluation of new onset tachypalpitations. His cardiovascular risk factor profile is notable for 50-pack-years of tobacco abuse currently smoking one pack per day and recalcitrant to risk factor modification. History of hyperlipidemia as well as family history the brother who died of a myocardial infarction at age 24. He did have a cardiac catheterization 02/17/92 by Dr. Mindi Junker little which was normal. He had a normal 2-D echo and Myoview stress test performed by Dr. Jenne Campus 04/11/16. He denies chest pain or shortness of breath. His father died in 09-05-2015. He's had 6 episodes of tachycardia palpitations since usually after significant exertion. Since I saw him 6 months ago his symptoms have completely resolved.   Current Outpatient Prescriptions  Medication Sig Dispense Refill  . acetaminophen (TYLENOL) 500 MG tablet Take 500 mg by mouth every 6 (six) hours as needed.    Marland Kitchen aspirin 81 MG tablet Take 81 mg by mouth daily.     Marland Kitchen atorvastatin (LIPITOR) 40 MG tablet Take 40 mg by mouth daily.      . Multiple Vitamin (MULTI-VITAMIN PO) Take by mouth daily.       No current facility-administered medications for this visit.     Allergies  Allergen Reactions  . Penicillins Anaphylaxis and Hives    Social History   Social History  . Marital status: Married    Spouse name: N/A  . Number of children: N/A  . Years of education: N/A   Occupational History  . Not on file.   Social History Main Topics  . Smoking status: Current Every Day Smoker  . Smokeless tobacco: Never  Used  . Alcohol use Yes     Comment: 1 or 2 beers per month  . Drug use: No  . Sexual activity: Not on file   Other Topics Concern  . Not on file   Social History Narrative  . No narrative on file     Review of Systems: General: negative for chills, fever, night sweats or weight changes.  Cardiovascular: negative for chest pain, dyspnea on exertion, edema, orthopnea, palpitations, paroxysmal nocturnal dyspnea or shortness of breath Dermatological: negative for rash Respiratory: negative for cough or wheezing Urologic: negative for hematuria Abdominal: negative for nausea, vomiting, diarrhea, bright red blood per rectum, melena, or hematemesis Neurologic: negative for visual changes, syncope, or dizziness All other systems reviewed and are otherwise negative except as noted above.    Blood pressure 122/72, pulse (!) 56, height 6\' 2"  (1.88 m), weight 183 lb (83 kg).  General appearance: alert and no distress Neck: no adenopathy, no carotid bruit, no JVD, supple, symmetrical, trachea midline and thyroid not enlarged, symmetric, no tenderness/mass/nodules Lungs: clear to auscultation bilaterally Heart: regular rate and rhythm, S1, S2 normal, no murmur, click, rub or gallop Extremities: extremities normal, atraumatic, no cyanosis or edema  EKG sinus bradycardia 56 without ST or T-wave changes. I personally reviewed this EKG.  ASSESSMENT AND PLAN:   Palpitations History of palpitations with event monitor performed in September of last year that showed sinus rhythm, sinus tachycardia, PSVT  and nonsustained VT. This was in the setting of a stressful event the loss of his father. Since that time he's had no further palpitations  Hyperlipidemia History of hyperlipidemia on statin therapy followed by his PCP      Runell GessJonathan J. Promise Bushong MD [facility] LLCFACP,FACC,FAHA, [facility] 10/03/2016 9:06 AM

## 2016-11-08 DIAGNOSIS — J339 Nasal polyp, unspecified: Secondary | ICD-10-CM | POA: Diagnosis not present

## 2016-11-08 DIAGNOSIS — J342 Deviated nasal septum: Secondary | ICD-10-CM | POA: Diagnosis not present

## 2016-11-08 DIAGNOSIS — J324 Chronic pansinusitis: Secondary | ICD-10-CM | POA: Diagnosis not present

## 2016-11-21 DIAGNOSIS — J329 Chronic sinusitis, unspecified: Secondary | ICD-10-CM | POA: Diagnosis not present

## 2016-11-21 DIAGNOSIS — J339 Nasal polyp, unspecified: Secondary | ICD-10-CM | POA: Diagnosis not present

## 2016-11-21 DIAGNOSIS — J324 Chronic pansinusitis: Secondary | ICD-10-CM | POA: Diagnosis not present

## 2016-12-26 DIAGNOSIS — J329 Chronic sinusitis, unspecified: Secondary | ICD-10-CM | POA: Diagnosis not present

## 2016-12-26 DIAGNOSIS — J321 Chronic frontal sinusitis: Secondary | ICD-10-CM | POA: Diagnosis not present

## 2016-12-26 DIAGNOSIS — J323 Chronic sphenoidal sinusitis: Secondary | ICD-10-CM | POA: Diagnosis not present

## 2016-12-26 DIAGNOSIS — J342 Deviated nasal septum: Secondary | ICD-10-CM | POA: Diagnosis not present

## 2016-12-26 DIAGNOSIS — J320 Chronic maxillary sinusitis: Secondary | ICD-10-CM | POA: Diagnosis not present

## 2016-12-26 DIAGNOSIS — J324 Chronic pansinusitis: Secondary | ICD-10-CM | POA: Diagnosis not present

## 2017-01-03 DIAGNOSIS — J324 Chronic pansinusitis: Secondary | ICD-10-CM | POA: Diagnosis not present

## 2017-01-10 DIAGNOSIS — J324 Chronic pansinusitis: Secondary | ICD-10-CM | POA: Diagnosis not present

## 2017-01-26 DIAGNOSIS — J324 Chronic pansinusitis: Secondary | ICD-10-CM | POA: Diagnosis not present

## 2017-04-05 IMAGING — NM NM MISC PROCEDURE
6 series · 36 of 36 positions shown · non-contrast
Comparison: none

[Series 1: wbr_r-proj_st wbr rest · 6.40mm/px · 6 of 64 frames shown]
[frame 6/64]
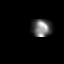
[frame 16/64]
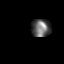
[frame 27/64]
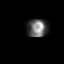
[frame 38/64]
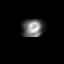
[frame 48/64]
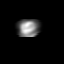
[frame 59/64]
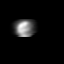

[Series 1: wbr rest · 6.40mm/px · 6 of 64 frames shown]
[frame 6/64]
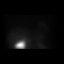
[frame 16/64]
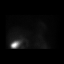
[frame 27/64]
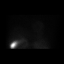
[frame 38/64]
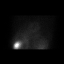
[frame 48/64]
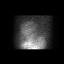
[frame 59/64]
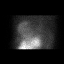

[Series 2: wbr stress-gsp · 6.40mm/px · 6 of 512 frames shown]
[frame 43/512]
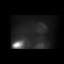
[frame 128/512]
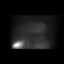
[frame 214/512]
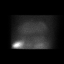
[frame 299/512]
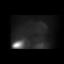
[frame 384/512]
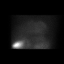
[frame 470/512]
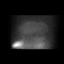

[Series 2: wbr_s-proj_st wbr stress-gsp · 6.40mm/px · 6 of 512 frames shown]
[frame 43/512]
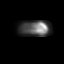
[frame 128/512]
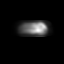
[frame 214/512]
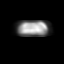
[frame 299/512]
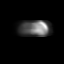
[frame 384/512]
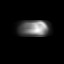
[frame 470/512]
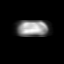

[Series 3: wbr stress-sum-em · 6.40mm/px · 6 of 64 frames shown]
[frame 6/64]
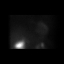
[frame 16/64]
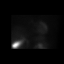
[frame 27/64]
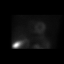
[frame 38/64]
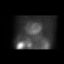
[frame 48/64]
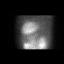
[frame 59/64]
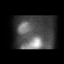

[Series 3: wbr_s-proj_st wbr stress-sum-em · 6.40mm/px · 6 of 64 frames shown]
[frame 6/64]
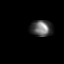
[frame 16/64]
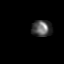
[frame 27/64]
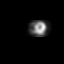
[frame 38/64]
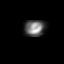
[frame 48/64]
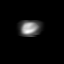
[frame 59/64]
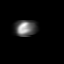

[36 of 36 positions shown; findings below may reference images not displayed]

Canned report from images found in remote index.

Refer to host system for actual result text.

## 2017-04-26 DIAGNOSIS — Z1211 Encounter for screening for malignant neoplasm of colon: Secondary | ICD-10-CM | POA: Diagnosis not present

## 2017-06-19 DIAGNOSIS — Z0000 Encounter for general adult medical examination without abnormal findings: Secondary | ICD-10-CM | POA: Diagnosis not present

## 2017-06-19 DIAGNOSIS — E785 Hyperlipidemia, unspecified: Secondary | ICD-10-CM | POA: Diagnosis not present

## 2017-06-19 DIAGNOSIS — R3121 Asymptomatic microscopic hematuria: Secondary | ICD-10-CM | POA: Diagnosis not present

## 2017-06-19 DIAGNOSIS — Z720 Tobacco use: Secondary | ICD-10-CM | POA: Diagnosis not present

## 2018-01-08 DIAGNOSIS — R05 Cough: Secondary | ICD-10-CM | POA: Diagnosis not present

## 2018-02-22 DIAGNOSIS — Z0000 Encounter for general adult medical examination without abnormal findings: Secondary | ICD-10-CM | POA: Diagnosis not present

## 2018-05-14 DIAGNOSIS — Z23 Encounter for immunization: Secondary | ICD-10-CM | POA: Diagnosis not present

## 2018-08-02 DIAGNOSIS — Z720 Tobacco use: Secondary | ICD-10-CM | POA: Diagnosis not present

## 2018-08-02 DIAGNOSIS — Z23 Encounter for immunization: Secondary | ICD-10-CM | POA: Diagnosis not present

## 2018-08-02 DIAGNOSIS — Z0000 Encounter for general adult medical examination without abnormal findings: Secondary | ICD-10-CM | POA: Diagnosis not present

## 2018-08-02 DIAGNOSIS — R109 Unspecified abdominal pain: Secondary | ICD-10-CM | POA: Diagnosis not present

## 2018-08-06 DIAGNOSIS — R319 Hematuria, unspecified: Secondary | ICD-10-CM | POA: Diagnosis not present

## 2018-10-29 DIAGNOSIS — L089 Local infection of the skin and subcutaneous tissue, unspecified: Secondary | ICD-10-CM | POA: Diagnosis not present

## 2018-10-29 DIAGNOSIS — L723 Sebaceous cyst: Secondary | ICD-10-CM | POA: Diagnosis not present

## 2019-04-23 DIAGNOSIS — Z23 Encounter for immunization: Secondary | ICD-10-CM | POA: Diagnosis not present

## 2019-08-21 ENCOUNTER — Ambulatory Visit: Payer: Medicare Other | Attending: Internal Medicine

## 2019-08-21 DIAGNOSIS — Z23 Encounter for immunization: Secondary | ICD-10-CM | POA: Insufficient documentation

## 2019-08-21 NOTE — Progress Notes (Signed)
   Covid-19 Vaccination Clinic  Name:  ACXEL DINGEE    MRN: [id number] DOB: [birth year]  08/21/2019  Mr. Wares was observed post Covid-19 immunization for 30 minutes based on pre-vaccination screening without incidence. He was provided with Vaccine Information Sheet and instruction to access the V-Safe system.   Mr. Pio was instructed to call 911 with any severe reactions post vaccine: Marland Kitchen Difficulty breathing  . Swelling of your face and throat  . A fast heartbeat  . A bad rash all over your body  . Dizziness and weakness    Immunizations Administered    Name Date Dose VIS Date Route   Pfizer COVID-19 Vaccine 08/21/2019  3:40 PM 0.3 mL 07/11/2019 Intramuscular   Manufacturer: [employer], [employer]   Lot: [id number]   NDC: [address]-4

## 2019-09-09 ENCOUNTER — Ambulatory Visit: Payer: Medicare Other | Attending: Internal Medicine

## 2019-09-09 DIAGNOSIS — Z23 Encounter for immunization: Secondary | ICD-10-CM | POA: Insufficient documentation

## 2019-09-09 NOTE — Progress Notes (Signed)
   Covid-19 Vaccination Clinic  Name:  Mitchell Villegas    MRN: [id number] DOB: 13-Jul-1946  09/09/2019  Mitchell Villegas was observed post Covid-19 immunization for 15 minutes without incidence. He was provided with Vaccine Information Sheet and instruction to access the V-Safe system.   Mitchell Villegas was instructed to call 911 with any severe reactions post vaccine: Marland Kitchen Difficulty breathing  . Swelling of your face and throat  . A fast heartbeat  . A bad rash all over your body  . Dizziness and weakness    Immunizations Administered    Name Date Dose VIS Date Route   Pfizer COVID-19 Vaccine 09/09/2019  9:16 AM 0.3 mL 07/11/2019 Intramuscular   Manufacturer: [employer], [employer]   Lot: [id number]   NDC: [address]-8

## 2019-10-08 DIAGNOSIS — N5201 Erectile dysfunction due to arterial insufficiency: Secondary | ICD-10-CM | POA: Diagnosis not present

## 2019-10-08 DIAGNOSIS — E785 Hyperlipidemia, unspecified: Secondary | ICD-10-CM | POA: Diagnosis not present

## 2019-10-08 DIAGNOSIS — Z0000 Encounter for general adult medical examination without abnormal findings: Secondary | ICD-10-CM | POA: Diagnosis not present

## 2019-10-08 DIAGNOSIS — Z125 Encounter for screening for malignant neoplasm of prostate: Secondary | ICD-10-CM | POA: Diagnosis not present

## 2019-10-23 DIAGNOSIS — M25521 Pain in right elbow: Secondary | ICD-10-CM | POA: Diagnosis not present

## 2019-10-23 DIAGNOSIS — M79641 Pain in right hand: Secondary | ICD-10-CM | POA: Diagnosis not present

## 2020-11-08 DIAGNOSIS — L814 Other melanin hyperpigmentation: Secondary | ICD-10-CM | POA: Diagnosis not present

## 2020-11-08 DIAGNOSIS — L905 Scar conditions and fibrosis of skin: Secondary | ICD-10-CM | POA: Diagnosis not present

## 2020-11-08 DIAGNOSIS — L570 Actinic keratosis: Secondary | ICD-10-CM | POA: Diagnosis not present

## 2020-11-08 DIAGNOSIS — L821 Other seborrheic keratosis: Secondary | ICD-10-CM | POA: Diagnosis not present

## 2020-11-10 DIAGNOSIS — E785 Hyperlipidemia, unspecified: Secondary | ICD-10-CM | POA: Diagnosis not present

## 2020-11-10 DIAGNOSIS — Z0000 Encounter for general adult medical examination without abnormal findings: Secondary | ICD-10-CM | POA: Diagnosis not present

## 2020-11-10 DIAGNOSIS — Z9109 Other allergy status, other than to drugs and biological substances: Secondary | ICD-10-CM | POA: Diagnosis not present

## 2020-11-10 DIAGNOSIS — Z23 Encounter for immunization: Secondary | ICD-10-CM | POA: Diagnosis not present

## 2021-02-07 DIAGNOSIS — L821 Other seborrheic keratosis: Secondary | ICD-10-CM | POA: Diagnosis not present

## 2021-02-07 DIAGNOSIS — D485 Neoplasm of uncertain behavior of skin: Secondary | ICD-10-CM | POA: Diagnosis not present

## 2021-02-07 DIAGNOSIS — D225 Melanocytic nevi of trunk: Secondary | ICD-10-CM | POA: Diagnosis not present

## 2021-02-07 DIAGNOSIS — L570 Actinic keratosis: Secondary | ICD-10-CM | POA: Diagnosis not present

## 2021-02-07 DIAGNOSIS — L814 Other melanin hyperpigmentation: Secondary | ICD-10-CM | POA: Diagnosis not present

## 2021-03-24 ENCOUNTER — Other Ambulatory Visit: Payer: Self-pay | Admitting: Physician Assistant

## 2021-03-24 DIAGNOSIS — R197 Diarrhea, unspecified: Secondary | ICD-10-CM

## 2021-03-25 ENCOUNTER — Other Ambulatory Visit (HOSPITAL_COMMUNITY): Payer: Self-pay | Admitting: Physician Assistant

## 2021-03-25 DIAGNOSIS — R197 Diarrhea, unspecified: Secondary | ICD-10-CM

## 2021-03-28 DIAGNOSIS — R197 Diarrhea, unspecified: Secondary | ICD-10-CM | POA: Diagnosis not present

## 2021-03-31 ENCOUNTER — Ambulatory Visit (HOSPITAL_COMMUNITY)
Admission: RE | Admit: 2021-03-31 | Discharge: 2021-03-31 | Disposition: A | Discharge status: Home / Self Care | Payer: Medicare Other | Source: Ambulatory Visit | Attending: Physician Assistant | Admitting: Physician Assistant

## 2021-03-31 ENCOUNTER — Other Ambulatory Visit: Payer: Self-pay

## 2021-03-31 DIAGNOSIS — R197 Diarrhea, unspecified: Secondary | ICD-10-CM | POA: Diagnosis not present

## 2021-03-31 DIAGNOSIS — N200 Calculus of kidney: Secondary | ICD-10-CM | POA: Diagnosis not present

## 2021-03-31 DIAGNOSIS — N281 Cyst of kidney, acquired: Secondary | ICD-10-CM | POA: Diagnosis not present

## 2021-03-31 DIAGNOSIS — K5730 Diverticulosis of large intestine without perforation or abscess without bleeding: Secondary | ICD-10-CM | POA: Diagnosis not present

## 2021-03-31 DIAGNOSIS — I700 Atherosclerosis of aorta: Secondary | ICD-10-CM | POA: Insufficient documentation

## 2021-03-31 LAB — POCT I-STAT CREATININE: Creatinine, Ser: 0.9 mg/dL (ref 0.61–1.24)

## 2021-03-31 MED ORDER — IOHEXOL 300 MG/ML  SOLN: 80.0000 mL | Freq: Once | INTRAMUSCULAR | Status: DC | PRN

## 2021-03-31 MED ORDER — IOHEXOL 350 MG/ML SOLN
80.0000 mL | Freq: Once | INTRAVENOUS | Status: AC | PRN
  Administered 2021-03-31: 80 mL via INTRAVENOUS

## 2021-04-05 DIAGNOSIS — R197 Diarrhea, unspecified: Secondary | ICD-10-CM | POA: Diagnosis not present

## 2021-04-13 ENCOUNTER — Other Ambulatory Visit: Payer: Medicare Other

## 2021-04-18 DIAGNOSIS — D125 Benign neoplasm of sigmoid colon: Secondary | ICD-10-CM | POA: Diagnosis not present

## 2021-04-18 DIAGNOSIS — K922 Gastrointestinal hemorrhage, unspecified: Secondary | ICD-10-CM | POA: Diagnosis not present

## 2021-04-18 DIAGNOSIS — R197 Diarrhea, unspecified: Secondary | ICD-10-CM | POA: Diagnosis not present

## 2021-04-18 DIAGNOSIS — K5140 Inflammatory polyps of colon without complications: Secondary | ICD-10-CM | POA: Diagnosis not present

## 2021-04-18 DIAGNOSIS — K635 Polyp of colon: Secondary | ICD-10-CM | POA: Diagnosis not present

## 2021-04-18 DIAGNOSIS — K5289 Other specified noninfective gastroenteritis and colitis: Secondary | ICD-10-CM | POA: Diagnosis not present

## 2021-04-18 DIAGNOSIS — D122 Benign neoplasm of ascending colon: Secondary | ICD-10-CM | POA: Diagnosis not present

## 2021-04-18 DIAGNOSIS — D123 Benign neoplasm of transverse colon: Secondary | ICD-10-CM | POA: Diagnosis not present

## 2021-04-18 DIAGNOSIS — K6389 Other specified diseases of intestine: Secondary | ICD-10-CM | POA: Diagnosis not present

## 2021-04-18 DIAGNOSIS — K648 Other hemorrhoids: Secondary | ICD-10-CM | POA: Diagnosis not present

## 2021-04-18 DIAGNOSIS — K5730 Diverticulosis of large intestine without perforation or abscess without bleeding: Secondary | ICD-10-CM | POA: Diagnosis not present

## 2021-04-22 DIAGNOSIS — D125 Benign neoplasm of sigmoid colon: Secondary | ICD-10-CM | POA: Diagnosis not present

## 2021-04-22 DIAGNOSIS — K635 Polyp of colon: Secondary | ICD-10-CM | POA: Diagnosis not present

## 2021-04-22 DIAGNOSIS — K5140 Inflammatory polyps of colon without complications: Secondary | ICD-10-CM | POA: Diagnosis not present

## 2021-04-22 DIAGNOSIS — K5289 Other specified noninfective gastroenteritis and colitis: Secondary | ICD-10-CM | POA: Diagnosis not present

## 2021-05-02 DIAGNOSIS — R197 Diarrhea, unspecified: Secondary | ICD-10-CM | POA: Diagnosis not present

## 2021-05-02 DIAGNOSIS — K5730 Diverticulosis of large intestine without perforation or abscess without bleeding: Secondary | ICD-10-CM | POA: Diagnosis not present

## 2021-05-09 DIAGNOSIS — Z23 Encounter for immunization: Secondary | ICD-10-CM | POA: Diagnosis not present

## 2021-05-24 DIAGNOSIS — L814 Other melanin hyperpigmentation: Secondary | ICD-10-CM | POA: Diagnosis not present

## 2021-05-24 DIAGNOSIS — Z872 Personal history of diseases of the skin and subcutaneous tissue: Secondary | ICD-10-CM | POA: Diagnosis not present

## 2021-05-24 DIAGNOSIS — L905 Scar conditions and fibrosis of skin: Secondary | ICD-10-CM | POA: Diagnosis not present

## 2021-07-06 DIAGNOSIS — R197 Diarrhea, unspecified: Secondary | ICD-10-CM | POA: Diagnosis not present

## 2021-11-18 DIAGNOSIS — E785 Hyperlipidemia, unspecified: Secondary | ICD-10-CM | POA: Diagnosis not present

## 2021-11-18 DIAGNOSIS — Z79899 Other long term (current) drug therapy: Secondary | ICD-10-CM | POA: Diagnosis not present

## 2021-11-18 DIAGNOSIS — Z125 Encounter for screening for malignant neoplasm of prostate: Secondary | ICD-10-CM | POA: Diagnosis not present

## 2021-11-21 DIAGNOSIS — R051 Acute cough: Secondary | ICD-10-CM | POA: Diagnosis not present

## 2021-11-25 DIAGNOSIS — Z0000 Encounter for general adult medical examination without abnormal findings: Secondary | ICD-10-CM | POA: Diagnosis not present

## 2021-11-25 DIAGNOSIS — N5201 Erectile dysfunction due to arterial insufficiency: Secondary | ICD-10-CM | POA: Diagnosis not present

## 2021-11-25 DIAGNOSIS — Z720 Tobacco use: Secondary | ICD-10-CM | POA: Diagnosis not present

## 2021-11-25 DIAGNOSIS — E785 Hyperlipidemia, unspecified: Secondary | ICD-10-CM | POA: Diagnosis not present

## 2022-02-07 DIAGNOSIS — D485 Neoplasm of uncertain behavior of skin: Secondary | ICD-10-CM | POA: Diagnosis not present

## 2022-02-07 DIAGNOSIS — D225 Melanocytic nevi of trunk: Secondary | ICD-10-CM | POA: Diagnosis not present

## 2022-02-07 DIAGNOSIS — L814 Other melanin hyperpigmentation: Secondary | ICD-10-CM | POA: Diagnosis not present

## 2022-02-07 DIAGNOSIS — L821 Other seborrheic keratosis: Secondary | ICD-10-CM | POA: Diagnosis not present

## 2022-05-12 ENCOUNTER — Other Ambulatory Visit (HOSPITAL_BASED_OUTPATIENT_CLINIC_OR_DEPARTMENT_OTHER): Payer: Self-pay | Admitting: Family Medicine

## 2022-05-12 DIAGNOSIS — F17210 Nicotine dependence, cigarettes, uncomplicated: Secondary | ICD-10-CM

## 2022-05-27 ENCOUNTER — Ambulatory Visit (HOSPITAL_BASED_OUTPATIENT_CLINIC_OR_DEPARTMENT_OTHER)
Admission: RE | Admit: 2022-05-27 | Discharge: 2022-05-27 | Disposition: A | Discharge status: Home / Self Care | Payer: Medicare Other | Source: Ambulatory Visit | Attending: Family Medicine | Admitting: Family Medicine

## 2022-05-27 DIAGNOSIS — F17210 Nicotine dependence, cigarettes, uncomplicated: Secondary | ICD-10-CM | POA: Insufficient documentation

## 2022-06-05 DIAGNOSIS — Z23 Encounter for immunization: Secondary | ICD-10-CM | POA: Diagnosis not present

## 2022-06-07 ENCOUNTER — Ambulatory Visit: Payer: Medicare Other | Attending: Cardiovascular Disease | Admitting: Cardiovascular Disease

## 2022-06-07 ENCOUNTER — Encounter: Payer: Self-pay | Admitting: Cardiovascular Disease

## 2022-06-07 VITALS — BP 130/90 | HR 62 | Ht 74.0 in | Wt 189.0 lb

## 2022-06-07 DIAGNOSIS — I25118 Atherosclerotic heart disease of native coronary artery with other forms of angina pectoris: Secondary | ICD-10-CM | POA: Diagnosis not present

## 2022-06-07 DIAGNOSIS — Z01812 Encounter for preprocedural laboratory examination: Secondary | ICD-10-CM | POA: Diagnosis not present

## 2022-06-07 NOTE — Patient Instructions (Signed)
Medication Instructions:  No changes *If you need a refill on your cardiac medications before your next appointment, please call your pharmacy*   Lab Work: Today: cbc, bmet  Testing/Procedures: Your physician has requested that you have a cardiac catheterization. Cardiac catheterization is used to diagnose and/or treat various heart conditions. Doctors may recommend this procedure for a number of different reasons. The most common reason is to evaluate chest pain. Chest pain can be a symptom of coronary artery disease (CAD), and cardiac catheterization can show whether plaque is narrowing or blocking your heart's arteries. This procedure is also used to evaluate the valves, as well as measure the blood flow and oxygen levels in different parts of your heart. For further information please visit [email or web address] Please follow instruction sheet, as given.   Follow-Up: At [facility], you and your health needs are our priority.  As part of our continuing mission to provide you with exceptional heart care, we have created designated Provider Care Teams.  These Care Teams include your primary Cardiologist (physician) and Advanced Practice Providers (APPs -  Physician Assistants and Nurse Practitioners) who all work together to provide you with the care you need, when you need it.   Your next appointment:   4-5 week(s) with Dr. Clifton James or Advanced Practice Provider (NP or PA-C)        Cardiac/Peripheral Catheterization   You are scheduled for a Cardiac Catheterization on Thursday, November 30 with Dr. Verne Carrow.  1. Please arrive at the Main Entrance A at [facility]: [address] [place], [place] [address] on November 30 at 5:30 AM (This time is two hours before your procedure to ensure your preparation). Free valet parking service is available. You will check in at ADMITTING. The support person will be asked to wait in the waiting room.  It is OK to have  someone drop you off and come back when you are ready to be discharged.        Special note: Every effort is made to have your procedure done on time. Please understand that emergencies sometimes delay scheduled procedures.   . 2. Diet: Do not eat solid foods after midnight.  You may have clear liquids until 5 AM the day of the procedure.  3. Labs: You will need to have blood drawn today. You do not need to be fasting.  4. Medication instructions in preparation for your procedure:   Contrast Allergy: No  On the morning of your procedure, take Aspirin 81 mg and any morning medicines NOT listed above.  You may use sips of water.  5. Plan to go home the same day, you will only stay overnight if medically necessary. 6. You MUST have a responsible adult to drive you home. 7. An adult MUST be with you the first 24 hours after you arrive home. 8. Bring a current list of your medications, and the last time and date medication taken. 9. Bring ID and current insurance cards. 10.Please wear clothes that are easy to get on and off and wear slip-on shoes.  Thank you for allowing [place] to care for you!   -- [facility]

## 2022-06-07 NOTE — H&P (View-Only) (Signed)
Chief Complaint  Patient presents with   New Patient (Initial Visit)    CAD on CT chest without contrast   History of Present Illness: 76 yo male with history of tobacco abuse, CAD, aortic atherosclerosis and hyperlipidemia who is here today to re-establish care in our office. He has been followed by Dr. Allyson Sabal and was last seen by Dr. Allyson Sabal in 2018. Per notes, he had a normal cardiac cath in 1993. Stress test in 2017 with inferolateral scar with no ischemia. Echo in 2017 with LVEF=55-60% and no valve disease. He had a recent chest CT without contrast for lung cancer screening and was found to have diffuse coronary calcification in the left main and all three major epicardial vessels. He tells me today that he has had dyspnea with exertion for several months and ongoing fatigue. No chest pain. He quit smoking 6 days ago.   Primary Care Physician: Henrine Screws, MD   Past Medical History:  Diagnosis Date   Aortic atherosclerosis [facility])    CAD (coronary artery disease)    Contact lens/glasses fitting    Hernia    Hyperlipidemia     Past Surgical History:  Procedure Laterality Date   arm surgery     Stitches for wound (cut) two places. 8 to 10 years ago.   HERNIA REPAIR  04/10/2011   bilateral inguinal   MOLE REMOVAL     about 12 years ago   NASAL SINUS SURGERY      Current Outpatient Medications  Medication Sig Dispense Refill   acetaminophen (TYLENOL) 500 MG tablet Take 500 mg by mouth every 6 (six) hours as needed.     aspirin 81 MG tablet Take 81 mg by mouth daily.      atorvastatin (LIPITOR) 40 MG tablet Take 40 mg by mouth daily.       Multiple Vitamin (MULTI-VITAMIN PO) Take by mouth daily.       No current facility-administered medications for this visit.    Allergies  Allergen Reactions   Penicillins Anaphylaxis and Hives    Social History   Socioeconomic History   Marital status: Married    Spouse name: Not on file   Number of children: 2   Years of  education: Not on file   Highest education level: Not on file  Occupational History   Occupation: Retired-[employer]  Tobacco Use   Smoking status: Former    Packs/day: 1.00    Years: 60.00    Total pack years: 60.00    Types: Cigarettes    Quit date: 06/01/2022    Years since quitting: 0.0   Smokeless tobacco: Never  Substance and Sexual Activity   Alcohol use: Yes    Comment: 1 or 2 beers per month   Drug use: No   Sexual activity: Not on file  Other Topics Concern   Not on file  Social History Narrative   Not on file   Social Determinants of Health   Financial Resource Strain: Not on file  Food Insecurity: Not on file  Transportation Needs: Not on file  Physical Activity: Not on file  Stress: Not on file  Social Connections: Not on file  Intimate Partner Violence: Not on file    Family History  Problem Relation Age of Onset   Diabetes Mother    Heart disease Father 49       heart attack in his 60s   Heart disease Brother        heart  attack    Review of Systems:  As stated in the HPI and otherwise negative.   BP (!) 130/90   Pulse 62   Ht 6\' 2"  (1.88 m)   Wt 189 lb (85.7 kg)   SpO2 98%   BMI 24.27 kg/m   Physical Examination: General: Well developed, well nourished, NAD  HEENT: OP clear, mucus membranes moist  SKIN: warm, dry. No rashes. Neuro: No focal deficits  Musculoskeletal: Muscle strength 5/5 all ext  Psychiatric: Mood and affect normal  Neck: No JVD, no carotid bruits, no thyromegaly, no lymphadenopathy.  Lungs:Clear bilaterally, no wheezes, rhonci, crackles Cardiovascular: Regular rate and rhythm. No murmurs, gallops or rubs. Abdomen:Soft. Bowel sounds present. Non-tender.  Extremities: No lower extremity edema. Pulses are 2 + in the bilateral DP/PT.  EKG:  EKG is ordered today. The ekg ordered today demonstrates Sinus, PVCs  Recent Labs: No results found for requested labs within last 365 days.   Lipid Panel No results found  for: "CHOL", "TRIG", "HDL", "CHOLHDL", "VLDL", "LDLCALC", "LDLDIRECT"   Wt Readings from Last 3 Encounters:  06/07/22 189 lb (85.7 kg)  10/03/16 183 lb (83 kg)  04/04/16 175 lb (79.4 kg)     Assessment and Plan:   1. CAD with angina: Coronary calcification noted on non contrast chest CT. His risk factors for obstructive CAD include age, hyperlipidemia and heavy tobacco abuse. I think a cardiac cath is indicated given his risk factors and the findings on chest CT.  -Will arrange a cardiac cath at [facility] on 06/29/22 at 7:30 am.  I have reviewed the risks, indications, and alternatives to cardiac catheterization, possible angioplasty, and stenting with the patient. Risks include but are not limited to bleeding, infection, vascular injury, stroke, myocardial infection, arrhythmia, kidney injury, radiation-related injury in the case of prolonged fluoroscopy use, emergency cardiac surgery, and death. The patient understands the risks of serious complication is 1-2 in 1000 with diagnostic cardiac cath and 1-2% or less with angioplasty/stenting.  -Continue ASA and statin -Pre-cath labs today.   2. Tobacco abuse: He has stopped smoking.   Labs/ tests ordered today include:   Orders Placed This Encounter  Procedures   Basic metabolic panel   CBC   EKG 12-Lead   Disposition:   F/U with me or office APP after cath    Signed, 07/01/22, MD, [facility] 06/07/2022 4:24 PM    [facility] [address] [place], [place], [place]  [place] Phone: [phone]; Fax: [phone]

## 2022-06-07 NOTE — Progress Notes (Signed)
Chief Complaint  Patient presents with   New Patient (Initial Visit)    CAD on CT chest without contrast   History of Present Illness: 76 yo male with history of tobacco abuse, CAD, aortic atherosclerosis and hyperlipidemia who is here today to re-establish care in our office. He has been followed by Dr. Allyson Sabal and was last seen by Dr. Allyson Sabal in 2018. Per notes, he had a normal cardiac cath in 1993. Stress test in 2017 with inferolateral scar with no ischemia. Echo in 2017 with LVEF=55-60% and no valve disease. He had a recent chest CT without contrast for lung cancer screening and was found to have diffuse coronary calcification in the left main and all three major epicardial vessels. He tells me today that he has had dyspnea with exertion for several months and ongoing fatigue. No chest pain. He quit smoking 6 days ago.   Primary Care Physician: Henrine Screws, MD   Past Medical History:  Diagnosis Date   Aortic atherosclerosis [facility])    CAD (coronary artery disease)    Contact lens/glasses fitting    Hernia    Hyperlipidemia     Past Surgical History:  Procedure Laterality Date   arm surgery     Stitches for wound (cut) two places. 8 to 10 years ago.   HERNIA REPAIR  04/10/2011   bilateral inguinal   MOLE REMOVAL     about 12 years ago   NASAL SINUS SURGERY      Current Outpatient Medications  Medication Sig Dispense Refill   acetaminophen (TYLENOL) 500 MG tablet Take 500 mg by mouth every 6 (six) hours as needed.     aspirin 81 MG tablet Take 81 mg by mouth daily.      atorvastatin (LIPITOR) 40 MG tablet Take 40 mg by mouth daily.       Multiple Vitamin (MULTI-VITAMIN PO) Take by mouth daily.       No current facility-administered medications for this visit.    Allergies  Allergen Reactions   Penicillins Anaphylaxis and Hives    Social History   Socioeconomic History   Marital status: Married    Spouse name: Not on file   Number of children: 2   Years of  education: Not on file   Highest education level: Not on file  Occupational History   Occupation: Retired-[employer]  Tobacco Use   Smoking status: Former    Packs/day: 1.00    Years: 60.00    Total pack years: 60.00    Types: Cigarettes    Quit date: 06/01/2022    Years since quitting: 0.0   Smokeless tobacco: Never  Substance and Sexual Activity   Alcohol use: Yes    Comment: 1 or 2 beers per month   Drug use: No   Sexual activity: Not on file  Other Topics Concern   Not on file  Social History Narrative   Not on file   Social Determinants of Health   Financial Resource Strain: Not on file  Food Insecurity: Not on file  Transportation Needs: Not on file  Physical Activity: Not on file  Stress: Not on file  Social Connections: Not on file  Intimate Partner Violence: Not on file    Family History  Problem Relation Age of Onset   Diabetes Mother    Heart disease Father 49       heart attack in his 60s   Heart disease Brother        heart  attack    Review of Systems:  As stated in the HPI and otherwise negative.   BP (!) 130/90   Pulse 62   Ht 6\' 2"  (1.88 m)   Wt 189 lb (85.7 kg)   SpO2 98%   BMI 24.27 kg/m   Physical Examination: General: Well developed, well nourished, NAD  HEENT: OP clear, mucus membranes moist  SKIN: warm, dry. No rashes. Neuro: No focal deficits  Musculoskeletal: Muscle strength 5/5 all ext  Psychiatric: Mood and affect normal  Neck: No JVD, no carotid bruits, no thyromegaly, no lymphadenopathy.  Lungs:Clear bilaterally, no wheezes, rhonci, crackles Cardiovascular: Regular rate and rhythm. No murmurs, gallops or rubs. Abdomen:Soft. Bowel sounds present. Non-tender.  Extremities: No lower extremity edema. Pulses are 2 + in the bilateral DP/PT.  EKG:  EKG is ordered today. The ekg ordered today demonstrates Sinus, PVCs  Recent Labs: No results found for requested labs within last 365 days.   Lipid Panel No results found  for: "CHOL", "TRIG", "HDL", "CHOLHDL", "VLDL", "LDLCALC", "LDLDIRECT"   Wt Readings from Last 3 Encounters:  06/07/22 189 lb (85.7 kg)  10/03/16 183 lb (83 kg)  04/04/16 175 lb (79.4 kg)     Assessment and Plan:   1. CAD with angina: Coronary calcification noted on non contrast chest CT. His risk factors for obstructive CAD include age, hyperlipidemia and heavy tobacco abuse. I think a cardiac cath is indicated given his risk factors and the findings on chest CT.  -Will arrange a cardiac cath at [facility] on 06/29/22 at 7:30 am.  I have reviewed the risks, indications, and alternatives to cardiac catheterization, possible angioplasty, and stenting with the patient. Risks include but are not limited to bleeding, infection, vascular injury, stroke, myocardial infection, arrhythmia, kidney injury, radiation-related injury in the case of prolonged fluoroscopy use, emergency cardiac surgery, and death. The patient understands the risks of serious complication is 1-2 in 1000 with diagnostic cardiac cath and 1-2% or less with angioplasty/stenting.  -Continue ASA and statin -Pre-cath labs today.   2. Tobacco abuse: He has stopped smoking.   Labs/ tests ordered today include:   Orders Placed This Encounter  Procedures   Basic metabolic panel   CBC   EKG 12-Lead   Disposition:   F/U with me or office APP after cath    Signed, 07/01/22, MD, [facility] 06/07/2022 4:24 PM    [facility] [address] [place], [place], [place]  [place] Phone: [phone]; Fax: [phone]

## 2022-06-08 LAB — CBC
Hematocrit: 47.8 % (ref 37.5–51.0)
Hemoglobin: 15.8 g/dL (ref 13.0–17.7)
MCH: 30.3 pg (ref 26.6–33.0)
MCHC: 33.1 g/dL (ref 31.5–35.7)
MCV: 92 fL (ref 79–97)
Platelets: 263 10*3/uL (ref 150–450)
RBC: 5.21 x10E6/uL (ref 4.14–5.80)
RDW: 12.6 % (ref 11.6–15.4)
WBC: 9.5 10*3/uL (ref 3.4–10.8)

## 2022-06-08 LAB — BASIC METABOLIC PANEL
BUN/Creatinine Ratio: 16 (ref 10–24)
BUN: 14 mg/dL (ref 8–27)
CO2: 25 mmol/L (ref 20–29)
Calcium: 9.1 mg/dL (ref 8.6–10.2)
Chloride: 101 mmol/L (ref 96–106)
Creatinine, Ser: 0.85 mg/dL (ref 0.76–1.27)
Glucose: 97 mg/dL (ref 70–99)
Potassium: 4.4 mmol/L (ref 3.5–5.2)
Sodium: 141 mmol/L (ref 134–144)
eGFR: 89 mL/min/{1.73_m2} (ref >59)

## 2022-06-27 ENCOUNTER — Telehealth: Payer: Self-pay | Admitting: *Deleted

## 2022-06-27 NOTE — Telephone Encounter (Signed)
Cardiac Catheterization scheduled at [facility] for: Thursday June 29, 2022 7:30 AM Arrival time and place: [facility] Main Entrance A at: 5:30 AM  Nothing to eat after midnight prior to procedure, clear liquids until 5 AM day of procedure  Medication instructions: -Usual morning medications can be taken with sips of water including aspirin 81 mg.  Confirmed patient has responsible adult to drive home post procedure and be with patient first 24 hours after arriving home.  Patient reports no new symptoms concerning for COVID-19 in the past 10 days.  Reviewed procedure instructions with patient.

## 2022-06-29 ENCOUNTER — Other Ambulatory Visit: Payer: Self-pay

## 2022-06-29 ENCOUNTER — Encounter (HOSPITAL_COMMUNITY)
Admission: RE | Disposition: A | Discharge status: Home / Self Care | Payer: Self-pay | Source: Home / Self Care | Attending: Cardiovascular Disease

## 2022-06-29 ENCOUNTER — Encounter (HOSPITAL_COMMUNITY): Payer: Self-pay | Admitting: Cardiovascular Disease

## 2022-06-29 ENCOUNTER — Ambulatory Visit (HOSPITAL_COMMUNITY)
Admission: RE | Admit: 2022-06-29 | Discharge: 2022-06-29 | Disposition: A | Discharge status: Home / Self Care | Payer: Medicare Other | Attending: Cardiovascular Disease | Admitting: Cardiovascular Disease

## 2022-06-29 DIAGNOSIS — I25119 Atherosclerotic heart disease of native coronary artery with unspecified angina pectoris: Secondary | ICD-10-CM | POA: Insufficient documentation

## 2022-06-29 DIAGNOSIS — Z87891 Personal history of nicotine dependence: Secondary | ICD-10-CM | POA: Insufficient documentation

## 2022-06-29 DIAGNOSIS — E785 Hyperlipidemia, unspecified: Secondary | ICD-10-CM | POA: Insufficient documentation

## 2022-06-29 DIAGNOSIS — I2584 Coronary atherosclerosis due to calcified coronary lesion: Secondary | ICD-10-CM | POA: Diagnosis not present

## 2022-06-29 DIAGNOSIS — I25118 Atherosclerotic heart disease of native coronary artery with other forms of angina pectoris: Secondary | ICD-10-CM

## 2022-06-29 DIAGNOSIS — I2510 Atherosclerotic heart disease of native coronary artery without angina pectoris: Secondary | ICD-10-CM | POA: Diagnosis not present

## 2022-06-29 HISTORY — PX: LEFT HEART CATH AND CORONARY ANGIOGRAPHY: CATH118249

## 2022-06-29 SURGERY — LEFT HEART CATH AND CORONARY ANGIOGRAPHY
Anesthesia: LOCAL

## 2022-06-29 MED ORDER — MIDAZOLAM HCL 2 MG/2ML IJ SOLN
INTRAMUSCULAR | Status: AC
  Filled 2022-06-29: qty 2

## 2022-06-29 MED ORDER — HEPARIN (PORCINE) IN NACL 1000-0.9 UT/500ML-% IV SOLN
INTRAVENOUS | Status: DC | PRN
  Administered 2022-06-29 (×2): 500 mL

## 2022-06-29 MED ORDER — VERAPAMIL HCL 2.5 MG/ML IV SOLN
INTRAVENOUS | Status: DC | PRN
  Administered 2022-06-29: 10 mL via INTRA_ARTERIAL

## 2022-06-29 MED ORDER — MIDAZOLAM HCL 2 MG/2ML IJ SOLN
INTRAMUSCULAR | Status: DC | PRN
  Administered 2022-06-29: 2 mg via INTRAVENOUS

## 2022-06-29 MED ORDER — ASPIRIN 81 MG PO CHEW: 81.0000 mg | CHEWABLE_TABLET | ORAL | Status: DC

## 2022-06-29 MED ORDER — HYDRALAZINE HCL 20 MG/ML IJ SOLN: 10.0000 mg | INTRAMUSCULAR | Status: DC | PRN

## 2022-06-29 MED ORDER — HEPARIN SODIUM (PORCINE) 1000 UNIT/ML IJ SOLN
INTRAMUSCULAR | Status: AC
  Filled 2022-06-29: qty 10

## 2022-06-29 MED ORDER — SODIUM CHLORIDE 0.9 % IV SOLN: 250.0000 mL | INTRAVENOUS | Status: DC | PRN

## 2022-06-29 MED ORDER — SODIUM CHLORIDE 0.9% FLUSH: 3.0000 mL | INTRAVENOUS | Status: DC | PRN

## 2022-06-29 MED ORDER — SODIUM CHLORIDE 0.9 % WEIGHT BASED INFUSION: 1.0000 mL/kg/h | INTRAVENOUS | Status: DC

## 2022-06-29 MED ORDER — ACETAMINOPHEN 325 MG PO TABS: 650.0000 mg | ORAL_TABLET | ORAL | Status: DC | PRN

## 2022-06-29 MED ORDER — HEPARIN SODIUM (PORCINE) 1000 UNIT/ML IJ SOLN
INTRAMUSCULAR | Status: DC | PRN
  Administered 2022-06-29: 4000 [IU] via INTRAVENOUS

## 2022-06-29 MED ORDER — SODIUM CHLORIDE 0.9 % WEIGHT BASED INFUSION
3.0000 mL/kg/h | INTRAVENOUS | Status: AC
  Administered 2022-06-29: 3 mL/kg/h via INTRAVENOUS

## 2022-06-29 MED ORDER — SODIUM CHLORIDE 0.9% FLUSH: 3.0000 mL | Freq: Two times a day (BID) | INTRAVENOUS | Status: DC

## 2022-06-29 MED ORDER — IOHEXOL 350 MG/ML SOLN
INTRAVENOUS | Status: DC | PRN
  Administered 2022-06-29: 50 mL

## 2022-06-29 MED ORDER — ONDANSETRON HCL 4 MG/2ML IJ SOLN: 4.0000 mg | Freq: Four times a day (QID) | INTRAMUSCULAR | Status: DC | PRN

## 2022-06-29 MED ORDER — SODIUM CHLORIDE 0.9 % IV SOLN: INTRAVENOUS | Status: AC

## 2022-06-29 MED ORDER — HEPARIN (PORCINE) IN NACL 1000-0.9 UT/500ML-% IV SOLN
INTRAVENOUS | Status: AC
  Filled 2022-06-29: qty 1000

## 2022-06-29 MED ORDER — VERAPAMIL HCL 2.5 MG/ML IV SOLN
INTRAVENOUS | Status: AC
  Filled 2022-06-29: qty 2

## 2022-06-29 MED ORDER — LABETALOL HCL 5 MG/ML IV SOLN: 10.0000 mg | INTRAVENOUS | Status: DC | PRN

## 2022-06-29 MED ORDER — FENTANYL CITRATE (PF) 100 MCG/2ML IJ SOLN
INTRAMUSCULAR | Status: DC | PRN
  Administered 2022-06-29: 50 ug via INTRAVENOUS

## 2022-06-29 MED ORDER — LIDOCAINE HCL (PF) 1 % IJ SOLN
INTRAMUSCULAR | Status: AC
  Filled 2022-06-29: qty 30

## 2022-06-29 MED ORDER — LIDOCAINE HCL (PF) 1 % IJ SOLN
INTRAMUSCULAR | Status: DC | PRN
  Administered 2022-06-29: 2 mL via INTRADERMAL

## 2022-06-29 MED ORDER — FENTANYL CITRATE (PF) 100 MCG/2ML IJ SOLN
INTRAMUSCULAR | Status: AC
  Filled 2022-06-29: qty 2

## 2022-06-29 SURGICAL SUPPLY — 10 items
CATH 5FR JL3.5 JR4 ANG PIG MP (CATHETERS) IMPLANT
DEVICE RAD COMP TR BAND LRG (VASCULAR PRODUCTS) IMPLANT
GLIDESHEATH SLEND SS 6F .021 (SHEATH) IMPLANT
GUIDEWIRE INQWIRE 1.5J.035X260 (WIRE) IMPLANT
INQWIRE 1.5J .035X260CM (WIRE) ×1
KIT HEART LEFT (KITS) ×1 IMPLANT
PACK CARDIAC CATHETERIZATION (CUSTOM PROCEDURE TRAY) ×1 IMPLANT
SYR MEDRAD MARK 7 150ML (SYRINGE) ×1 IMPLANT
TRANSDUCER W/STOPCOCK (MISCELLANEOUS) ×1 IMPLANT
TUBING CIL FLEX 10 FLL-RA (TUBING) ×1 IMPLANT

## 2022-06-29 NOTE — Interval H&P Note (Signed)
History and Physical Interval Note:  06/29/2022 7:12 AM  Mitchell Villegas  has presented today for surgery, with the diagnosis of cad , dyspnea.  The various methods of treatment have been discussed with the patient and family. After consideration of risks, benefits and other options for treatment, the patient has consented to  Procedure(s): LEFT HEART CATH AND CORONARY ANGIOGRAPHY (N/A) as a surgical intervention.  The patient's history has been reviewed, patient examined, no change in status, stable for surgery.  I have reviewed the patient's chart and labs.  Questions were answered to the patient's satisfaction.    Cath Lab Visit (complete for each Cath Lab visit)  Clinical Evaluation Leading to the Procedure:   ACS: No.  Non-ACS:    Anginal Classification: CCS II  Anti-ischemic medical therapy: No Therapy  Non-Invasive Test Results: No non-invasive testing performed  Prior CABG: No previous CABG        Verne Carrow

## 2022-06-29 NOTE — Progress Notes (Signed)
TR BAND REMOVAL  LOCATION:    right radial  DEFLATED PER PROTOCOL:    Yes.    TIME BAND OFF / DRESSING APPLIED:    0958 gauze dressing applied   SITE UPON ARRIVAL:    Level 0  SITE AFTER BAND REMOVAL:    Level 0  CIRCULATION SENSATION AND MOVEMENT:    Within Normal Limits   Yes.    COMMENTS:   no issues noted

## 2022-06-29 NOTE — Discharge Instructions (Signed)

## 2022-07-03 ENCOUNTER — Ambulatory Visit (HOSPITAL_BASED_OUTPATIENT_CLINIC_OR_DEPARTMENT_OTHER): Payer: Medicare Other

## 2022-07-11 NOTE — Progress Notes (Signed)
Office Visit    Patient Name: Mitchell Villegas Date of Encounter: 07/11/2022  Primary Care Provider:  Henrine Screws, MD Primary Cardiologist:  Verne Carrow, MD Primary Electrophysiologist: None  Chief Complaint    Mitchell Villegas is a 76 y.o. male with PMH of CAD s/p LHC 1993 and 2023, HLD, aortic atherosclerosis who presents today for post heart catheterization follow-up.  Past Medical History    Past Medical History:  Diagnosis Date   Aortic atherosclerosis (HCC)    CAD (coronary artery disease)    Contact lens/glasses fitting    Hernia    Hyperlipidemia    Past Surgical History:  Procedure Laterality Date   arm surgery     Stitches for wound (cut) two places. 8 to 10 years ago.   HERNIA REPAIR  04/10/2011   bilateral inguinal   LEFT HEART CATH AND CORONARY ANGIOGRAPHY N/A 06/29/2022   Procedure: LEFT HEART CATH AND CORONARY ANGIOGRAPHY;  Surgeon: Kathleene Hazel, MD;  Location: [facility];  Service: Cardiovascular;  Laterality: N/A;   MOLE REMOVAL     about 12 years ago   NASAL SINUS SURGERY      Allergies  Allergies  Allergen Reactions   Penicillins Anaphylaxis and Hives    History of Present Illness    Mitchell Villegas  is a 76 year old male with the above mention past medical history who presents today for follow-up of recent left heart catheterization.  Mitchell Villegas has been followed by Dr. Allyson Sabal previously and was last seen in 2018.  He underwent normal left heart cath in 1993 stress test performed in 2017 that revealed inferior lateral scar with no ischemia.  2D echo was also completed in 2017 with of 55 to 60% and no valvular disease present.  She underwent recent CT for lung cancer screening that revealed diffuse coronary artery calcification.  He was seen by Dr. Clifton James on 11/8 to reestablish care for cardiology.  Due to patient's previous history and most recent CT results LHC was recommended for further evaluation.  Dr. Clifton James  completed LHC on 06/29/22 results indicating mild nonobstructive CAD with heavily calcified RCA and LAD with 30% stenosed lesion and proximal to mid LAD and 20% stenosis lesion in the mid RCA.  Medical management was recommended at that time and patient continue Lipitor and ASA 81 mg.  Mitchell Villegas presents today with his wife for post left heart catheterization visit.  Since last being seen in the office patient reports that he is doing well following his procedure.  His blood pressure today was 145/78 and heart rate was 70 bpm.  During the visit we reviewed his cardiac catheterization procedure notes and discussed the importance of maintaining primary prevention and reducing progression of cardiac disease.  He was allowed time for questions and all were answered to his satisfaction.  Patient denies chest pain, palpitations, dyspnea, PND, orthopnea, nausea, vomiting, dizziness, syncope, edema, weight gain, or early satiety.  Home Medications    Current Outpatient Medications  Medication Sig Dispense Refill   acetaminophen (TYLENOL) 500 MG tablet Take 500 mg by mouth every 6 (six) hours as needed for moderate pain.     aspirin 81 MG tablet Take 81 mg by mouth daily.      atorvastatin (LIPITOR) 40 MG tablet Take 40 mg by mouth daily.       diclofenac Sodium (VOLTAREN) 1 % GEL Apply 1 Application topically 4 (four) times daily as needed (pain).  fluticasone (FLONASE) 50 MCG/ACT nasal spray Place 2 sprays into both nostrils daily.     Multiple Vitamin (MULTI-VITAMIN PO) Take 1 tablet by mouth daily.     No current facility-administered medications for this visit.     Review of Systems  Please see the history of present illness.      All other systems reviewed and are otherwise negative except as noted above.  Physical Exam    Wt Readings from Last 3 Encounters:  06/29/22 185 lb (83.9 kg)  06/07/22 189 lb (85.7 kg)  10/03/16 183 lb (83 kg)   WU:[id number] were no vitals filed for this  visit.,There is no height or weight on file to calculate BMI.  Constitutional:      Appearance: Healthy appearance. Not in distress.  Neck:     Vascular: JVD normal.  Pulmonary:     Effort: Pulmonary effort is normal.     Breath sounds: No wheezing. No rales. Diminished in the bases Cardiovascular:     Normal rate. Regular rhythm. Normal S1. Normal S2.      Murmurs: There is no murmur and right radial site with no bruit or thrill and no hematoma present Edema:    Peripheral edema absent.  Abdominal:     Palpations: Abdomen is soft non tender. There is no hepatomegaly.  Skin:    General: Skin is warm and dry.  Neurological:     General: No focal deficit present.     Mental Status: Alert and oriented to person, place and time.     Cranial Nerves: Cranial nerves are intact.  EKG/LABS/Other Studies Reviewed    ECG personally reviewed by me today -none completed today   Lab Results  Component Value Date   WBC 9.5 06/07/2022   HGB 15.8 06/07/2022   HCT 47.8 06/07/2022   MCV 92 06/07/2022   PLT 263 06/07/2022   Lab Results  Component Value Date   CREATININE 0.85 06/07/2022   BUN 14 06/07/2022   NA 141 06/07/2022   K 4.4 06/07/2022   CL 101 06/07/2022   CO2 25 06/07/2022   Lab Results  Component Value Date   ALT 23 05/29/2011   AST 19 05/29/2011   ALKPHOS 53 05/29/2011   BILITOT 0.3 05/29/2011   No results found for: "CHOL", "HDL", "LDLCALC", "LDLDIRECT", "TRIG", "CHOLHDL"  No results found for: "HGBA1C"  Assessment & Plan    1.  Coronary artery disease: -s/p LHC performed 05/2022 due to abnormal CT of the chest that revealed nonobstructive CAD with calcifications in LAD and RCA -Today patient reports no chest pain or discomfort. -Right radial cath site was clean and dry with no evidence of hematoma bounding pulse. -Primary prevention discussed and importance of adhering to medication compliance -Continue GDMT with ASA 81 mg and Lipitor 40 mg  2.   Hyperlipidemia: -Patient's last LDL was 66 at goal of less than 70 -Continue Lipitor 40 mg as noted above -Patient encouraged to increase physical activity to 150 minutes of moderate activity per week  3.  Tobacco abuse: -Today patient reports full abstinence since November -He was congratulated and encouraged to continue  4.  Palpitations: -Resolved patient denied any recurrence  Disposition: Follow-up with Verne Carrow, MD or APP in 12 months    Medication Adjustments/Labs and Tests Ordered: Current medicines are reviewed at length with the patient today.  Concerns regarding medicines are outlined above.   Signed, Napoleon Form, Leodis Rains, NP 07/11/2022, 10:13 AM [facility]  Note:  This document was prepared using Dragon voice recognition software and may include unintentional dictation errors.

## 2022-07-12 ENCOUNTER — Ambulatory Visit: Payer: Medicare Other | Attending: Nurse Practitioner | Admitting: Nurse Practitioner

## 2022-07-12 ENCOUNTER — Encounter: Payer: Self-pay | Admitting: Nurse Practitioner

## 2022-07-12 VITALS — BP 145/78 | HR 70 | Ht 74.0 in | Wt 197.0 lb

## 2022-07-12 DIAGNOSIS — R002 Palpitations: Secondary | ICD-10-CM

## 2022-07-12 DIAGNOSIS — E7800 Pure hypercholesterolemia, unspecified: Secondary | ICD-10-CM | POA: Diagnosis not present

## 2022-07-12 DIAGNOSIS — Z720 Tobacco use: Secondary | ICD-10-CM | POA: Diagnosis not present

## 2022-07-12 DIAGNOSIS — I2510 Atherosclerotic heart disease of native coronary artery without angina pectoris: Secondary | ICD-10-CM

## 2022-07-12 NOTE — Patient Instructions (Signed)
Medication Instructions:  Your physician recommends that you continue on your current medications as directed. Please refer to the Current Medication list given to you today. *If you need a refill on your cardiac medications before your next appointment, please call your pharmacy*   Lab Work: None Ordered   Testing/Procedures: None Ordered   Follow-Up: At [facility], you and your health needs are our priority.  As part of our continuing mission to provide you with exceptional heart care, we have created designated Provider Care Teams.  These Care Teams include your primary Cardiologist (physician) and Advanced Practice Providers (APPs -  Physician Assistants and Nurse Practitioners) who all work together to provide you with the care you need, when you need it.  We recommend signing up for the patient portal called "MyChart".  Sign up information is provided on this After Visit Summary.  MyChart is used to connect with patients for Virtual Visits (Telemedicine).  Patients are able to view lab/test results, encounter notes, upcoming appointments, etc.  Non-urgent messages can be sent to your provider as well.   To learn more about what you can do with MyChart, go to [email or web address].    Your next appointment:   12 month(s)  The format for your next appointment:   In Person  Provider:   Christopher McAlhany, MD     Other Instructions   Important Information About Sugar       

## 2022-08-18 DIAGNOSIS — K5900 Constipation, unspecified: Secondary | ICD-10-CM | POA: Diagnosis not present

## 2022-09-23 DIAGNOSIS — M545 Low back pain, unspecified: Secondary | ICD-10-CM | POA: Diagnosis not present

## 2022-11-03 ENCOUNTER — Ambulatory Visit: Payer: Medicare Other | Admitting: Podiatry

## 2022-11-03 ENCOUNTER — Encounter: Payer: Self-pay | Admitting: Podiatry

## 2022-11-03 DIAGNOSIS — L84 Corns and callosities: Secondary | ICD-10-CM | POA: Diagnosis not present

## 2022-11-03 DIAGNOSIS — M216X9 Other acquired deformities of unspecified foot: Secondary | ICD-10-CM

## 2022-11-03 DIAGNOSIS — M7751 Other enthesopathy of right foot: Secondary | ICD-10-CM | POA: Diagnosis not present

## 2022-11-03 MED ORDER — TRIAMCINOLONE ACETONIDE 10 MG/ML IJ SUSP
10.0000 mg | Freq: Once | INTRAMUSCULAR | Status: AC
  Administered 2022-11-03: 10 mg

## 2022-11-03 NOTE — Progress Notes (Signed)
Subjective:   Patient ID: Mitchell Villegas, male   DOB: 77 y.o.   MRN: [id number]   HPI Patient presents stating he has polio and he is always walked differently and is got a severe lesion underneath his right foot with fluid buildup that is been very painful and in the past he has been able to take care of it.  Patient has just stopped smoking and is trying to exercise more   Review of Systems  All other systems reviewed and are negative.       Objective:  Physical Exam Vitals and nursing note reviewed.  Constitutional:      Appearance: He is well-developed.  Pulmonary:     Effort: Pulmonary effort is normal.  Musculoskeletal:        General: Normal range of motion.  Skin:    General: Skin is warm.  Neurological:     Mental Status: He is alert.     Neurovascular status intact muscle strength found to be adequate range of motion within normal limits with inflammation fluid around the first MPJ right that is painful when pressed with lesion also present and abnormal gait process with increased pressure on this area secondary to polio when he was a child.  Good digital perfusion well-oriented x 3     Assessment:  Inflammatory capsulitis of the first MPJ right plantar with fluid buildup lesion formation and abnormal gait process     Plan:  H&P and discussed condition with him.  At this time I did do a plantar capsular steroid injection 3 mg Dexasone Kenalog 5 mg Xylocaine and I applied a dancers pad to offload weight after debridement that was done courtesy.  I explained possible orthotics long-term depending on results and hopefully this will solve the problem

## 2022-11-17 DIAGNOSIS — E785 Hyperlipidemia, unspecified: Secondary | ICD-10-CM | POA: Diagnosis not present

## 2022-11-17 DIAGNOSIS — Z125 Encounter for screening for malignant neoplasm of prostate: Secondary | ICD-10-CM | POA: Diagnosis not present

## 2022-11-17 DIAGNOSIS — Z136 Encounter for screening for cardiovascular disorders: Secondary | ICD-10-CM | POA: Diagnosis not present

## 2022-11-17 DIAGNOSIS — Z0000 Encounter for general adult medical examination without abnormal findings: Secondary | ICD-10-CM | POA: Diagnosis not present

## 2022-11-20 DIAGNOSIS — R7309 Other abnormal glucose: Secondary | ICD-10-CM | POA: Diagnosis not present

## 2022-12-05 DIAGNOSIS — K5140 Inflammatory polyps of colon without complications: Secondary | ICD-10-CM | POA: Diagnosis not present

## 2022-12-05 DIAGNOSIS — J439 Emphysema, unspecified: Secondary | ICD-10-CM | POA: Diagnosis not present

## 2022-12-05 DIAGNOSIS — I700 Atherosclerosis of aorta: Secondary | ICD-10-CM | POA: Diagnosis not present

## 2022-12-05 DIAGNOSIS — Z0000 Encounter for general adult medical examination without abnormal findings: Secondary | ICD-10-CM | POA: Diagnosis not present

## 2022-12-12 DIAGNOSIS — M25562 Pain in left knee: Secondary | ICD-10-CM | POA: Diagnosis not present

## 2022-12-12 DIAGNOSIS — M25561 Pain in right knee: Secondary | ICD-10-CM | POA: Diagnosis not present

## 2023-02-06 ENCOUNTER — Ambulatory Visit: Payer: Medicare Other | Admitting: Orthopedic Surgery

## 2023-02-06 ENCOUNTER — Other Ambulatory Visit (INDEPENDENT_AMBULATORY_CARE_PROVIDER_SITE_OTHER): Payer: Medicare Other

## 2023-02-06 ENCOUNTER — Encounter: Payer: Self-pay | Admitting: Orthopedic Surgery

## 2023-02-06 VITALS — BP 155/81 | HR 36 | Ht 74.0 in | Wt 197.0 lb

## 2023-02-06 DIAGNOSIS — M25562 Pain in left knee: Secondary | ICD-10-CM

## 2023-02-06 DIAGNOSIS — S83412A Sprain of medial collateral ligament of left knee, initial encounter: Secondary | ICD-10-CM

## 2023-02-06 MED ORDER — METHYLPREDNISOLONE 4 MG PO TBPK: ORAL_TABLET | ORAL | 0 refills | Status: DC

## 2023-02-06 NOTE — Progress Notes (Addendum)
Orthopedic Surgery Progress Note   Assessment: Patient is a 77 y.o. male with left knee pain.  Has pain over the MCL but mechanism does not point to that as the pathology.  Has physical exam findings of meniscal tear   Plan: -Patient has tried tylenol, R.I.C.E. -Recommend continuing with rest, ice, compression, elevation.  Prescribed a hinged knee brace.  Also prescribed a Medrol Dosepak -Activity as tolerated -Will see how he is doing from a knee perspective at our next visit when we talk about his back -Patient already has an office visit to discuss his back. Will need AP/lateral/flex/ex lumbar films at that visit  ___________________________________________________________________________  Subjective: Patient has had left knee pain for several days now.  He states he was walking up the driveway bringing in the trash cans when he felt a pop in the knee.  He noted acute onset of pain.  He feels on the medial aspect of the knee.  He does not have any pain elsewhere in the knee.  He has been resting, icing, elevating the leg and has noted pain decreasing with time.  There was swelling on the knee that has somewhat improved.   Past Medical History:  Diagnosis Date   Aortic atherosclerosis (HCC)    CAD (coronary artery disease)    Contact lens/glasses fitting    Hernia    Hyperlipidemia    Past Surgical History:  Procedure Laterality Date   arm surgery     Stitches for wound (cut) two places. 8 to 10 years ago.   HERNIA REPAIR  04/10/2011   bilateral inguinal   LEFT HEART CATH AND CORONARY ANGIOGRAPHY N/A 06/29/2022   Procedure: LEFT HEART CATH AND CORONARY ANGIOGRAPHY;  Surgeon: Kathleene Hazel, MD;  Location: [facility];  Service: Cardiovascular;  Laterality: N/A;   MOLE REMOVAL     about 12 years ago   NASAL SINUS SURGERY     Social History   Tobacco Use   Smoking status: Former    Packs/day: 1.00    Years: 60.00    Additional pack years: 0.00    Total  pack years: 60.00    Types: Cigarettes    Quit date: 06/01/2022    Years since quitting: 0.6   Smokeless tobacco: Never  Substance Use Topics   Alcohol use: Yes    Comment: 1 or 2 beers per month   Denies recreational drug use  Physical Exam:  BMI of 25.3  General: no acute distress, appears stated age Neurologic: alert, answering questions appropriately, following commands Respiratory: unlabored breathing on room air, symmetric chest rise Psychiatric: appropriate affect, normal cadence to speech   MSK:   -Left lower extremity Tender to palpation over the MCL and medial joint line, no other tenderness palpation Small effusion palpated Knee range of motion from 0-110 Knee stable to varus and valgus stress.  Negative Lachman.  Negative posterior drawer Pain with McMurray but no palpable click EHL/TA/GSC intact Sensation intact to light touch in sural, saphenous, tibial, deep peroneal, and superficial peroneal nerve distributions Foot warm and well perfused  Imaging:  X-ray of the left knee from 02/06/2023 was independently reviewed and interpreted, showing no fracture or dislocation.  Slight amount of joint space narrowing noted medially.  Small osteophyte seen on the medial aspect of the patella.   Patient name: Mitchell Villegas Patient MRN: [id number] Date: 02/06/23

## 2023-02-21 ENCOUNTER — Ambulatory Visit: Payer: Medicare Other | Admitting: Orthopedic Surgery

## 2023-02-21 ENCOUNTER — Other Ambulatory Visit: Payer: Self-pay

## 2023-02-21 DIAGNOSIS — M545 Low back pain, unspecified: Secondary | ICD-10-CM

## 2023-02-21 MED ORDER — PREGABALIN 75 MG PO CAPS: 75.0000 mg | ORAL_CAPSULE | Freq: Two times a day (BID) | ORAL | 0 refills | Status: DC

## 2023-02-21 MED ORDER — CYCLOBENZAPRINE HCL 10 MG PO TABS: 10.0000 mg | ORAL_TABLET | Freq: Three times a day (TID) | ORAL | 0 refills | Status: DC | PRN

## 2023-02-21 NOTE — Progress Notes (Signed)
Orthopedic Spine Surgery Office Note  Assessment: Patient is a 77 y.o. male with low back pain that radiates into the left lower extremity along the anterior thigh and into the anterior leg, suspect radiculopathy.  Knee pain has improved since Medrol Dosepak and icing   Plan: -Explained that initially conservative treatment is tried as a significant number of patients may experience relief with these treatment modalities. Discussed that the conservative treatments include:  -activity modification  -physical therapy  -over the counter pain medications  -medrol dosepak  -lumbar steroid injections -Patient has tried Medrol Dosepak, Tylenol -In regards to his knee, I told him to continue to ice and use the brace for heavier activities.  He does not need to use the brace when around the house -For his low back, I recommended a home exercise program.  A packet was provided to him today.  I also prescribed Flexeril and Lyrica to help with the pain.  If he is not doing any better in 6 weeks, we will get an MRI of the lumbar spine to evaluate further -Patient should return to office in 6 weeks, x-rays at next visit: None   Patient expressed understanding of the plan and all questions were answered to the patient's satisfaction.   ___________________________________________________________________________  History: Patient is a 77 y.o. male who has been previously for left knee pain.  He states that his left knee pain has improved significantly since doing the Medrol Dosepak.  He has also been using a brace that has been helpful.  He has continued to ice the knee as well.  He feels that the swelling is gone down to the knee.  He also wanted talk about his low back pain.  He has had low back pain for over a year now.  He states that he gets pain that radiates into the left lower extremity along the anterior thigh and at times goes into the anterior leg.  He gets more mild than intermittent pain on the  right side but is not nearly as severe as on the left side.  He has previously used prednisone even before my last prescription and that had helped in the past.  Pain has gotten worse recently.  He states that it specially gets worse if he is doing more manual activities.  He gave the example of working on a mower.  Previous treatments: Medrol Dosepak, Tylenol   Physical Exam:  General: no acute distress, appears stated age Neurologic: alert, answering questions appropriately, following commands Respiratory: unlabored breathing on room air, symmetric chest rise Psychiatric: appropriate affect, normal cadence to speech   MSK (spine):  -Strength exam      Left  Right EHL    5/5  5/5 TA    5/5  5/5 GSC    5/5  5/5 Knee extension  5/5  5/5 Hip flexion   5/5  5/5  -Sensory exam    Sensation intact to light touch in L3-S1 nerve distributions of bilateral lower extremities  -Achilles DTR: 1/4 on the left, 1/4 on the right -Patellar tendon DTR: 1/4 on the left, 1/4 on the right  -Straight leg raise: Negative bilaterally -Clonus: no beats bilaterally -Left hip exam: Positive FADIR, no pain through remainder range of motion, negative FABER, negative SI joint compression test, negative Stinchfield -Right hip exam: No pain to range of motion, negative FABER, negative SI joint compression test, negative Stinchfield  -Left knee exam: Nontender to palpation over the knee, knee range of motion from 0-110,  knee stable to varus valgus stress, negative Lachman, negative posterior drawer. Sensation intact light touch in sural, saphenous, tibial, deep peroneal, superficial peroneal nerve prescriptions.  Foot warm and well-perfused.  Imaging: X-ray of the left knee from 02/06/2023 was independently reviewed and interpreted, showing no fracture or dislocation. Slight amount of joint space narrowing noted medially. Small osteophyte seen on the medial aspect of the patella.   XR of the lumbar spine  from 02/21/2023 was independently reviewed and interpreted, showing lumbar scoliosis with apex to the right.  Disc height loss at L3/4, L4/5, L5/S1.  No fracture or dislocation seen.  No evidence of instability on flexion/extension views.   Patient name: Mitchell Villegas Patient MRN: [id number] Date of visit: 02/21/23

## 2023-03-01 DIAGNOSIS — D225 Melanocytic nevi of trunk: Secondary | ICD-10-CM | POA: Diagnosis not present

## 2023-03-01 DIAGNOSIS — L570 Actinic keratosis: Secondary | ICD-10-CM | POA: Diagnosis not present

## 2023-03-01 DIAGNOSIS — L814 Other melanin hyperpigmentation: Secondary | ICD-10-CM | POA: Diagnosis not present

## 2023-03-01 DIAGNOSIS — L821 Other seborrheic keratosis: Secondary | ICD-10-CM | POA: Diagnosis not present

## 2023-04-04 ENCOUNTER — Ambulatory Visit: Payer: Medicare Other | Admitting: Orthopedic Surgery

## 2023-04-04 DIAGNOSIS — M5416 Radiculopathy, lumbar region: Secondary | ICD-10-CM

## 2023-04-04 MED ORDER — PREGABALIN 50 MG PO CAPS: 50.0000 mg | ORAL_CAPSULE | Freq: Two times a day (BID) | ORAL | 0 refills | Status: DC

## 2023-04-04 NOTE — Progress Notes (Signed)
Orthopedic Spine Surgery Office Note   Assessment: Patient is a 77 y.o. male with low back pain that radiates into the left lower extremity along the anterior thigh and into the anterior leg, suspect radiculopathy. Also, left knee pain. Both have improved significantly since he was last seen.      Plan: -Explained that initially conservative treatment is tried as a significant number of patients may experience relief with these treatment modalities. Discussed that the conservative treatments include:             -activity modification             -physical therapy             -over the counter pain medications             -medrol dosepak             -lumbar steroid injections -Patient has tried Medrol Dosepak, Tylenol, Lyrica, home excise program, Flexeril -Since he is doing better with the home exercise program and Lyrica, I told him to keep both going.  I provided him with a new prescription of Lyrica.  I told him that he can start to wean down on the Lyrica since he is doing better.  I told him to start with taking it once daily and then after a couple weeks time and then after a couple of weeks time, come off of it completely -Patient should return to office on an as-needed basis     Patient expressed understanding of the plan and all questions were answered to the patient's satisfaction.    ___________________________________________________________________________   History: Patient is a 77 y.o. male who comes in today for follow-up on his radiating leg pain and left knee pain.  He states that since he was last seen he is doing much better.  He has been doing the home exercise program and taking Lyrica.  He states that his radiating leg pain has substantially improved.  He still feels some pain around the medial aspect of the knee but it is tolerable.  He was able to do yard work yesterday without any issue.  Has not noticed any weakness.  Denies paresthesias and numbness.  No bowel or  bladder incontinence.  No saddle anesthesia.   Previous treatments: Medrol Dosepak, Tylenol, lyrica, flexeril, home exercise program     Physical Exam:   General: no acute distress, appears stated age Neurologic: alert, answering questions appropriately, following commands Respiratory: unlabored breathing on room air, symmetric chest rise Psychiatric: appropriate affect, normal cadence to speech     MSK (spine):   -Strength exam                                                   Left                  Right EHL                              5/5                  5/5 TA  5/5                  5/5 GSC                             5/5                  5/5 Knee extension            5/5                  5/5 Hip flexion                    5/5                  5/5   -Sensory exam                           Sensation intact to light touch in L3-S1 nerve distributions of bilateral lower extremities   -Achilles DTR: 1/4 on the left, 1/4 on the right -Patellar tendon DTR: 1/4 on the left, 1/4 on the right   -Straight leg raise: Negative bilaterally -Clonus: no beats bilaterally   -Left knee exam: Nontender to palpation over the knee, no effusion, knee range of motion from 0-110, knee stable to varus valgus stress, negative Lachman, negative posterior drawer   Imaging: X-ray of the left knee from 02/06/2023 was previously independently reviewed and interpreted, showing no fracture or dislocation. Slight amount of joint space narrowing noted medially. Small osteophyte seen on the medial aspect of the patella.    XR of the lumbar spine from 02/21/2023 was previously independently reviewed and interpreted, showing lumbar scoliosis with apex to the right.  Disc height loss at L3/4, L4/5, L5/S1.  No fracture or dislocation seen.  No evidence of instability on flexion/extension views.     Patient name: Mitchell Villegas Patient MRN: [id number] Date of visit: 04/04/23

## 2023-05-29 ENCOUNTER — Other Ambulatory Visit: Payer: Self-pay | Admitting: Family Medicine

## 2023-05-29 DIAGNOSIS — R911 Solitary pulmonary nodule: Secondary | ICD-10-CM

## 2023-06-08 ENCOUNTER — Ambulatory Visit
Admission: RE | Admit: 2023-06-08 | Discharge: 2023-06-08 | Disposition: A | Discharge status: Home / Self Care | Payer: Medicare Other | Source: Ambulatory Visit | Attending: Family Medicine | Admitting: Family Medicine

## 2023-06-08 ENCOUNTER — Other Ambulatory Visit: Payer: Self-pay | Admitting: Family Medicine

## 2023-06-08 DIAGNOSIS — Z136 Encounter for screening for cardiovascular disorders: Secondary | ICD-10-CM | POA: Diagnosis not present

## 2023-06-08 DIAGNOSIS — Z87891 Personal history of nicotine dependence: Secondary | ICD-10-CM | POA: Diagnosis not present

## 2023-06-08 DIAGNOSIS — R911 Solitary pulmonary nodule: Secondary | ICD-10-CM

## 2023-06-08 DIAGNOSIS — Z122 Encounter for screening for malignant neoplasm of respiratory organs: Secondary | ICD-10-CM | POA: Diagnosis not present

## 2023-06-25 ENCOUNTER — Encounter: Payer: Self-pay | Admitting: Family Medicine

## 2023-08-15 DIAGNOSIS — R198 Other specified symptoms and signs involving the digestive system and abdomen: Secondary | ICD-10-CM | POA: Diagnosis not present

## 2023-08-15 DIAGNOSIS — K625 Hemorrhage of anus and rectum: Secondary | ICD-10-CM | POA: Diagnosis not present

## 2023-08-19 NOTE — Progress Notes (Unsigned)
Cardiology Office Note    Patient Name: Mitchell Villegas Date of Encounter: 08/19/2023  Primary Care Provider:  Henrine Screws, MD Primary Cardiologist:  Verne Carrow, MD Primary Electrophysiologist: None   Past Medical History    Past Medical History:  Diagnosis Date   Aortic atherosclerosis (HCC)    CAD (coronary artery disease)    Contact lens/glasses fitting    Hernia    Hyperlipidemia     History of Present Illness  Mitchell Villegas is a 78 y.o. male with PMH of CAD s/p LHC 1993 and 2023, HLD, aortic atherosclerosis who presents today for 1 year follow-up.  Mitchell Villegas seen by Dr. Clifton James on 06/07/22 to reestablish care for cardiology.  Due to patient's previous history and most recent CT results LHC was recommended for further evaluation.  Dr. Clifton James completed LHC on 06/29/22 results indicating mild nonobstructive CAD with heavily calcified RCA and LAD with 30% stenosed lesion and proximal to mid LAD and 20% stenosis lesion in the mid RCA.  Medical management was recommended at that time and patient continue Lipitor and ASA 81 mg.  He was seen in follow-up on 07/13/2023 and reported doing well with BP mildly elevated at 145/78.  He reported full abstinence from tobacco abuse at that time noted no palpitations. There were no medication changes made at that time.   Mitchell Villegas presents today with his wife for 1 year follow-up.  He presents for a cardiology follow-up. The patient has been experiencing PVCs, as noted on a recent EKG, but denies any associated symptoms such as palpitations or shortness of breath. The patient does report feeling tired, which may be related to his cardiac condition or his gastrointestinal issues. The patient's IBS has been causing frequent loose stools, with up to eight to ten episodes per day during flare-ups. This has been managed with changes in medication, including switching from Metamucil gummies to Miralax. The patient's symptoms have  improved in recent days, with fewer episodes of loose stools. However, the frequent loose stools may have affected the patient's electrolyte balance, which could be contributing to the PVCs.The patient also reports rectal bleeding, which has been improving with steroid treatment. The patient has a history of smoking but has quit, which is a significant improvement in his cardiovascular risk profile.  Patient denies chest pain, palpitations, dyspnea, PND, orthopnea, nausea, vomiting, dizziness, syncope, edema, weight gain, or early satiety.   Review of Systems  Please see the history of present illness.    All other systems reviewed and are otherwise negative except as noted above.  Physical Exam    Wt Readings from Last 3 Encounters:  02/06/23 197 lb (89.4 kg)  07/12/22 197 lb (89.4 kg)  06/29/22 185 lb (83.9 kg)   ZO:XWRUE were no vitals filed for this visit.,There is no height or weight on file to calculate BMI. GEN: Well nourished, well developed in no acute distress Neck: No JVD; No carotid bruits Pulmonary: Clear to auscultation without rales, wheezing or rhonchi  Cardiovascular: Normal rate. Regular rhythm. Normal S1. Normal S2.   Murmurs: There is no murmur.  ABDOMEN: Soft, non-tender, non-distended EXTREMITIES:  No edema; No deformity   EKG/LABS/ Recent Cardiac Studies   ECG personally reviewed by me today -sinus rhythm with PVCs and bigeminal pattern with a rate of 78 bpm and no acute changes consistent with previous EKG.  Risk Assessment/Calculations:          Lab Results  Component Value Date   WBC 9.5  06/07/2022   HGB 15.8 06/07/2022   HCT 47.8 06/07/2022   MCV 92 06/07/2022   PLT 263 06/07/2022   Lab Results  Component Value Date   CREATININE 0.85 06/07/2022   BUN 14 06/07/2022   NA 141 06/07/2022   K 4.4 06/07/2022   CL 101 06/07/2022   CO2 25 06/07/2022   No results found for: "CHOL", "HDL", "LDLCALC", "LDLDIRECT", "TRIG", "CHOLHDL"  No results found  for: "HGBA1C" Assessment & Plan    1. Coronary artery disease: -s/p LHC performed 05/2022 due to abnormal CT of the chest that revealed nonobstructive CAD with calcifications in LAD and RCA -Today patient denies any cardiac symptoms or angina since previous follow-up. -Continue current management including baby aspirin and statin therapy. -Consider temporary cessation of baby aspirin if significant bleeding occurs.  2. Hyperlipidemia: -Patient's last LDL was 66 at goal of less than 70 Continue atorvastatin 40 mg daily -Continue heart healthy diet and increase physical activity to at least 150 minutes/week as tolerated  3.  Premature Ventricular Contractions (PVCs) Noted on EKG with a pattern of bigeminy. No associated symptoms reported. Possible electrolyte imbalance due to gastrointestinal issues. -Check electrolytes (calcium, potassium, magnesium) today. -If electrolytes are normal, schedule an echocardiogram to assess cardiac function.  4.Tobacco abuse: Successful cessation of smoking. -Continue abstinence from tobacco.  5.Preoperative Clearance: -Patient's RCRI score is 0.9%  -The patient affirms he has been doing well without any new cardiac symptoms. They are able to achieve 6 METS without cardiac limitations. Therefore, based on ACC/AHA guidelines, the patient would be at acceptable risk for the planned procedure without further cardiovascular testing. The patient was advised that if he develops new symptoms prior to surgery to contact our office to arrange for a follow-up visit, and he verbalized understanding.   -Per protocol patient can hold ASA 81 mg 5 to 7 days prior to procedure and should restart postprocedure when surgically safe and advised by performing physician.  Disposition: Follow-up with Verne Carrow, MD or APP in 12 months    Signed, Napoleon Form, Leodis Rains, NP 08/19/2023, 2:05 PM [facility]

## 2023-08-20 ENCOUNTER — Encounter: Payer: Self-pay | Admitting: Nurse Practitioner

## 2023-08-20 ENCOUNTER — Ambulatory Visit: Payer: Medicare Other | Attending: Nurse Practitioner | Admitting: Nurse Practitioner

## 2023-08-20 VITALS — BP 132/66 | HR 57 | Ht 74.0 in | Wt 199.0 lb

## 2023-08-20 DIAGNOSIS — I2510 Atherosclerotic heart disease of native coronary artery without angina pectoris: Secondary | ICD-10-CM

## 2023-08-20 DIAGNOSIS — I493 Ventricular premature depolarization: Secondary | ICD-10-CM

## 2023-08-20 DIAGNOSIS — Z720 Tobacco use: Secondary | ICD-10-CM | POA: Diagnosis not present

## 2023-08-20 DIAGNOSIS — E7800 Pure hypercholesterolemia, unspecified: Secondary | ICD-10-CM

## 2023-08-20 DIAGNOSIS — R002 Palpitations: Secondary | ICD-10-CM

## 2023-08-20 NOTE — Patient Instructions (Signed)
Medication Instructions:  Your physician recommends that you continue on your current medications as directed. Please refer to the Current Medication list given to you today.  *If you need a refill on your cardiac medications before your next appointment, please call your pharmacy*   Lab Work: BMP, MG  If you have labs (blood work) drawn today and your tests are completely normal, you will receive your results only by: MyChart Message (if you have MyChart) OR A paper copy in the mail If you have any lab test that is abnormal or we need to change your treatment, we will call you to review the results.   Testing/Procedures: NONE   Follow-Up: At [facility], you and your health needs are our priority.  As part of our continuing mission to provide you with exceptional heart care, we have created designated Provider Care Teams.  These Care Teams include your primary Cardiologist (physician) and Advanced Practice Providers (APPs -  Physician Assistants and Nurse Practitioners) who all work together to provide you with the care you need, when you need it.   Your next appointment:   1 year(s)  Provider:   Verne Carrow, MD  or Robin Searing, NP        Other Instructions Try Pedialyte

## 2023-08-21 ENCOUNTER — Other Ambulatory Visit: Payer: Self-pay

## 2023-08-21 ENCOUNTER — Telehealth: Payer: Self-pay

## 2023-08-21 ENCOUNTER — Emergency Department (HOSPITAL_BASED_OUTPATIENT_CLINIC_OR_DEPARTMENT_OTHER): Payer: Medicare Other

## 2023-08-21 ENCOUNTER — Encounter (HOSPITAL_BASED_OUTPATIENT_CLINIC_OR_DEPARTMENT_OTHER): Payer: Self-pay

## 2023-08-21 ENCOUNTER — Ambulatory Visit: Payer: Medicare Other | Attending: Nurse Practitioner

## 2023-08-21 ENCOUNTER — Emergency Department (HOSPITAL_BASED_OUTPATIENT_CLINIC_OR_DEPARTMENT_OTHER)
Admission: EM | Admit: 2023-08-21 | Discharge: 2023-08-21 | Disposition: A | Discharge status: Home / Self Care | Payer: Medicare Other | Attending: Emergency Medicine | Admitting: Emergency Medicine

## 2023-08-21 DIAGNOSIS — I2510 Atherosclerotic heart disease of native coronary artery without angina pectoris: Secondary | ICD-10-CM | POA: Diagnosis not present

## 2023-08-21 DIAGNOSIS — I493 Ventricular premature depolarization: Secondary | ICD-10-CM

## 2023-08-21 DIAGNOSIS — Z7982 Long term (current) use of aspirin: Secondary | ICD-10-CM | POA: Insufficient documentation

## 2023-08-21 DIAGNOSIS — M25512 Pain in left shoulder: Secondary | ICD-10-CM | POA: Diagnosis not present

## 2023-08-21 DIAGNOSIS — R079 Chest pain, unspecified: Secondary | ICD-10-CM | POA: Diagnosis not present

## 2023-08-21 LAB — BASIC METABOLIC PANEL
Anion gap: 8 (ref 5–15)
BUN/Creatinine Ratio: 16 (ref 10–24)
BUN: 16 mg/dL (ref 8–27)
BUN: 17 mg/dL (ref 8–23)
CO2: 26 mmol/L (ref 20–29)
CO2: 26 mmol/L (ref 22–32)
Calcium: 9.6 mg/dL (ref 8.6–10.2)
Calcium: 8.8 mg/dL — ABNORMAL LOW (ref 8.9–10.3)
Chloride: 102 mmol/L (ref 96–106)
Chloride: 104 mmol/L (ref 98–111)
Creatinine, Ser: 0.98 mg/dL (ref 0.76–1.27)
Creatinine, Ser: 0.83 mg/dL (ref 0.61–1.24)
GFR, Estimated: >60 mL/min (ref >60)
Glucose, Bld: 118 mg/dL — ABNORMAL HIGH (ref 70–99)
Glucose: 102 mg/dL — ABNORMAL HIGH (ref 70–99)
Potassium: 4.2 mmol/L (ref 3.5–5.2)
Potassium: 3.9 mmol/L (ref 3.5–5.1)
Sodium: 142 mmol/L (ref 134–144)
Sodium: 138 mmol/L (ref 135–145)
eGFR: 79 mL/min/{1.73_m2} (ref >59)

## 2023-08-21 LAB — CBC WITH DIFFERENTIAL/PLATELET
Abs Immature Granulocytes: 0.02 10*3/uL (ref 0.00–0.07)
Basophils Absolute: 0.1 10*3/uL (ref 0.0–0.1)
Basophils Relative: 1 %
Eosinophils Absolute: 0.3 10*3/uL (ref 0.0–0.5)
Eosinophils Relative: 4 %
HCT: 43.3 % (ref 39.0–52.0)
Hemoglobin: 14.6 g/dL (ref 13.0–17.0)
Immature Granulocytes: 0 %
Lymphocytes Relative: 36 %
Lymphs Abs: 2.8 10*3/uL (ref 0.7–4.0)
MCH: 30.2 pg (ref 26.0–34.0)
MCHC: 33.7 g/dL (ref 30.0–36.0)
MCV: 89.6 fL (ref 80.0–100.0)
Monocytes Absolute: 0.8 10*3/uL (ref 0.1–1.0)
Monocytes Relative: 10 %
Neutro Abs: 3.9 10*3/uL (ref 1.7–7.7)
Neutrophils Relative %: 49 %
Platelets: 227 10*3/uL (ref 150–400)
RBC: 4.83 MIL/uL (ref 4.22–5.81)
RDW: 12.7 % (ref 11.5–15.5)
WBC: 7.8 10*3/uL (ref 4.0–10.5)
nRBC: 0 % (ref 0.0–0.2)

## 2023-08-21 LAB — TROPONIN I (HIGH SENSITIVITY)
Troponin I (High Sensitivity): 5 ng/L (ref <18)
Troponin I (High Sensitivity): 5 ng/L (ref <18)

## 2023-08-21 LAB — MAGNESIUM: Magnesium: 2.1 mg/dL (ref 1.6–2.3)

## 2023-08-21 MED ORDER — LIDOCAINE 5 % EX PTCH
2.0000 | MEDICATED_PATCH | CUTANEOUS | Status: DC
  Administered 2023-08-21: 2 via TRANSDERMAL
  Filled 2023-08-21: qty 2

## 2023-08-21 MED ORDER — ACETAMINOPHEN 500 MG PO TABS
1000.0000 mg | ORAL_TABLET | Freq: Once | ORAL | Status: AC
  Administered 2023-08-21: 1000 mg via ORAL
  Filled 2023-08-21: qty 2

## 2023-08-21 NOTE — ED Triage Notes (Signed)
Pt reports LT shoulder pain that radiates up to his neck that began in the middle of the night. Pt reports he had an EKG yesterday in the office and PVC's (bigeminy) were noted. Pt is anxious about it. Pt is denying CP at this time. Denies SOB, N/V. Endorses IBS so has BM issues.

## 2023-08-21 NOTE — Telephone Encounter (Signed)
-----   Message from Napoleon Form, Leodis Rains sent at 08/21/2023  9:01 AM EST ----- Please let patient know that his magnesium and electrolytes are stable.  Please order 2D echo to evaluate heart structure and rule out cardiomyopathy.  Please also order a 14-day Zio patch to evaluate and estimate PVC burden.  Please advise patient to let us know if he has any questions or develops any symptoms with his PVCs.   Robin Searing, NP

## 2023-08-21 NOTE — ED Provider Notes (Signed)
[name] [facility] AT [facility] Provider Note   CSN: [id number] Arrival date & time: 08/21/23  0154     History  Chief Complaint  Patient presents with   Shoulder Pain    Radiates to LT neck     Mitchell Villegas is a 78 y.o. male.  The history is provided by the patient.  Shoulder Pain Location:  Shoulder Shoulder location:  L shoulder Injury: no   Pain details:    Radiates to:  L upper arm   Severity:  Mild   Onset quality:  Sudden   Duration:  2 hours   Timing:  Constant   Progression:  Unchanged Dislocation: no   Relieved by:  Nothing Worsened by:  Nothing Ineffective treatments:  None tried Associated symptoms: decreased range of motion   Associated symptoms: no fever, no muscle weakness, no neck pain, no numbness, no swelling and no tingling   Risk factors: no concern for non-accidental trauma   Patient with non obstructive CAD seen at cardiology this afternoon and told Mitchell Villegas has PVCs.  Mitchell Villegas is feeling stressed related to this.  Mitchell Villegas awoke to use the bathroom and had left shoulder/upper arm and axillary pain.  No trauma.  No CP, no SOB,  no n/v/d.  Feels pain with motion and feels motion of LUE is slightly diminished.      Past Medical History:  Diagnosis Date   Aortic atherosclerosis (HCC)    CAD (coronary artery disease)    Contact lens/glasses fitting    Hernia    Hyperlipidemia      Home Medications Prior to Admission medications   Medication Sig Start Date End Date Taking? Authorizing Provider  acetaminophen (TYLENOL) 500 MG tablet Take 500 mg by mouth every 6 (six) hours as needed for moderate pain.    [provider]  aspirin 81 MG tablet Take 81 mg by mouth daily.     [provider]  atorvastatin (LIPITOR) 40 MG tablet Take 40 mg by mouth daily.      [provider]  fluticasone (FLONASE) 50 MCG/ACT nasal spray Place 2 sprays into both nostrils daily.    [provider]  Multiple Vitamin  (MULTI-VITAMIN PO) Take 1 tablet by mouth daily.    [provider]      Allergies    Penicillins    Review of Systems   Review of Systems  Constitutional:  Negative for fever.  Respiratory:  Negative for shortness of breath, wheezing and stridor.   Cardiovascular:  Negative for chest pain and leg swelling.  Musculoskeletal:  Positive for arthralgias. Negative for neck pain.    Physical Exam Updated Vital Signs BP 127/61   Pulse (!) 54   Temp 98.1 F (36.7 C) (Oral)   Resp 17   Ht 6\' 2"  (1.88 m)   Wt 88 kg   SpO2 95%   BMI 24.91 kg/m  Physical Exam Vitals and nursing note reviewed.  Constitutional:      General: Mitchell Villegas is not in acute distress.    Appearance: Mitchell Villegas is well-developed. Mitchell Villegas is not diaphoretic.  HENT:     Head: Normocephalic and atraumatic.  Eyes:     Conjunctiva/sclera: Conjunctivae normal.     Pupils: Pupils are equal, round, and reactive to light.  Cardiovascular:     Rate and Rhythm: Normal rate and regular rhythm.  Pulmonary:     Effort: Pulmonary effort is normal.     Breath sounds: Normal breath sounds. No wheezing  or rales.  Abdominal:     General: Bowel sounds are normal.     Palpations: Abdomen is soft.     Tenderness: There is no abdominal tenderness. There is no guarding or rebound.  Musculoskeletal:        General: Normal range of motion.     Left shoulder: Tenderness present. No swelling, deformity, effusion, laceration, bony tenderness or crepitus. Normal strength. Normal pulse.     Left upper arm: Normal.     Left elbow: Normal.     Cervical back: Normal range of motion and neck supple.     Comments: Negative neers test of the left shoulder, pain with motion intact biceps and triceps and deltoid tendons   Skin:    General: Skin is warm and dry.  Neurological:     Mental Status: Mitchell Villegas is alert and oriented to person, place, and time.     ED Results / Procedures / Treatments   Labs (all labs ordered are listed, but only abnormal  results are displayed) Results for orders placed or performed during the hospital encounter of 08/21/23  CBC with Differential   Collection Time: 08/21/23  2:08 AM  Result Value Ref Range   WBC 7.8 4.0 - 10.5 K/uL   RBC 4.83 4.22 - 5.81 MIL/uL   Hemoglobin 14.6 13.0 - 17.0 g/dL   HCT 11.9 14.7 - 82.9 %   MCV 89.6 80.0 - 100.0 fL   MCH 30.2 26.0 - 34.0 pg   MCHC 33.7 30.0 - 36.0 g/dL   RDW 56.2 13.0 - 86.5 %   Platelets 227 150 - 400 K/uL   nRBC 0.0 0.0 - 0.2 %   Neutrophils Relative % 49 %   Neutro Abs 3.9 1.7 - 7.7 K/uL   Lymphocytes Relative 36 %   Lymphs Abs 2.8 0.7 - 4.0 K/uL   Monocytes Relative 10 %   Monocytes Absolute 0.8 0.1 - 1.0 K/uL   Eosinophils Relative 4 %   Eosinophils Absolute 0.3 0.0 - 0.5 K/uL   Basophils Relative 1 %   Basophils Absolute 0.1 0.0 - 0.1 K/uL   Immature Granulocytes 0 %   Abs Immature Granulocytes 0.02 0.00 - 0.07 K/uL  Basic metabolic panel   Collection Time: 08/21/23  2:08 AM  Result Value Ref Range   Sodium 138 135 - 145 mmol/L   Potassium 3.9 3.5 - 5.1 mmol/L   Chloride 104 98 - 111 mmol/L   CO2 26 22 - 32 mmol/L   Glucose, Bld 118 (H) 70 - 99 mg/dL   BUN 17 8 - 23 mg/dL   Creatinine, Ser 7.84 0.61 - 1.24 mg/dL   Calcium 8.8 (L) 8.9 - 10.3 mg/dL   GFR, Estimated >69 >62 mL/min   Anion gap 8 5 - 15  Troponin I (High Sensitivity)   Collection Time: 08/21/23  2:08 AM  Result Value Ref Range   Troponin I (High Sensitivity) 5 <18 ng/L   DG Chest Portable 1 View Result Date: 08/21/2023 CLINICAL DATA:  Chest pain EXAM: PORTABLE CHEST 1 VIEW COMPARISON:  None Available. FINDINGS: The heart size and mediastinal contours are within normal limits. Both lungs are clear. The visualized skeletal structures are unremarkable. IMPRESSION: No active disease. Electronically Signed   By: Helyn Numbers M.D.   On: 08/21/2023 02:23    EKG  Date: 08/21/2023  Rate: 69   Rhythm: normal sinus rhythm  QRS Axis: normal  Intervals: normal  ST/T Wave  abnormalities: normal  Conduction Disutrbances:  PVCs  Narrative Interpretation: bigeminy      Radiology DG Chest Portable 1 View Result Date: 08/21/2023 CLINICAL DATA:  Chest pain EXAM: PORTABLE CHEST 1 VIEW COMPARISON:  None Available. FINDINGS: The heart size and mediastinal contours are within normal limits. Both lungs are clear. The visualized skeletal structures are unremarkable. IMPRESSION: No active disease. Electronically Signed   By: Helyn Numbers M.D.   On: 08/21/2023 02:23    Procedures Procedures    Medications Ordered in ED Medications  lidocaine (LIDODERM) 5 % 2 patch (2 patches Transdermal Patch Applied 08/21/23 0220)  acetaminophen (TYLENOL) tablet 1,000 mg (1,000 mg Oral Given 08/21/23 0220)    ED Course/ Medical Decision Making/ A&P                                 Medical Decision Making Patient with left shoulder pain after being told by cardiology something might be off with electrolytes as a source of PVCs  Amount and/or Complexity of Data Reviewed Independent Historian: spouse    Details: See above  External Data Reviewed: labs and notes.    Details: Previous notes reviewed magnesium was top normal  Labs: ordered.    Details: Troponin normal 5 and 5. Normal sodium 138, normal potassium 3.9, normal creatinine. Normal white count 7.8, normal hemoglobin 14.6, normal platelets  Radiology: ordered and independent interpretation performed.    Details: Normal CXR by me ECG/medicine tests: ordered and independent interpretation performed. Decision-making details documented in ED Course.  Risk OTC drugs. Prescription drug management. Risk Details: Ruled out for MI in the ED.  This is classically MSK in nature.  Tylenol and ibuprofen for pain.  Ice as tolerated.  Follow up with cardiology regarding PVCs.  Stable for discharge.      Final Clinical Impression(s) / ED Diagnoses Final diagnoses:  None   Return for intractable cough, coughing up blood, fevers  > 100.4 unrelieved by medication, shortness of breath, intractable vomiting, chest pain, shortness of breath, weakness, numbness, changes in speech, facial asymmetry, abdominal pain, passing out, Inability to tolerate liquids or food, cough, altered mental status or any concerns. No signs of systemic illness or infection. The patient is nontoxic-appearing on exam and vital signs are within normal limits.  I have reviewed the triage vital signs and the nursing notes. Pertinent labs & imaging results that were available during my care of the patient were reviewed by me and considered in my medical decision making (see chart for details). After history, exam, and medical workup I feel the patient has been appropriately medically screened and is safe for discharge home. Pertinent diagnoses were discussed with the patient. Patient was given return precautions.  Rx / DC Orders ED Discharge Orders     None         Rhian Funari, MD 08/21/23 1610

## 2023-08-21 NOTE — Telephone Encounter (Signed)
Discuss lab results with patient. Echo and 14 day Zio ordered per Robin Searing, NP's recommendation.  A scheduler will call patient to schedule echo and Zio heart monitor will be mailed to patient.  Patient verbalized understanding.

## 2023-08-21 NOTE — ED Notes (Signed)
ED Provider at bedside. 

## 2023-08-21 NOTE — Progress Notes (Unsigned)
Enrolled for [employer] to mail a ZIO XT long term holter monitor to the patients address on file.   Dr. McAlhany to read. 

## 2023-08-22 ENCOUNTER — Encounter: Payer: Self-pay | Admitting: Surgery

## 2023-08-26 DIAGNOSIS — I493 Ventricular premature depolarization: Secondary | ICD-10-CM | POA: Diagnosis not present

## 2023-09-04 ENCOUNTER — Telehealth: Payer: Self-pay | Admitting: *Deleted

## 2023-09-04 NOTE — Telephone Encounter (Signed)
     Primary Cardiologist: Lonni Cash, MD  Chart reviewed as part of pre-operative protocol coverage. Given past medical history and time since last visit, based on ACC/AHA guidelines, Allah L Ayala would be at acceptable risk for the planned procedure without further cardiovascular testing.   Patient recently seen in the clinic and was doing well at that time.  He may proceed with planned procedure.  His RCRI is low risk, 0.9% risk of major cardiac event.  He is able to complete greater than 6 METS of physical activity.  His aspirin  may be held for 5 to 7 days prior to his procedure.  Please resume as soon as hemostasis is achieved.  I will route this recommendation to the requesting party via Epic fax function and remove from pre-op pool.  Please call with questions.  Josefa HERO. Edwing Figley NP-C     09/04/2023, 1:02 PM [facility] [address] Office [phone] Fax [phone]

## 2023-09-04 NOTE — Telephone Encounter (Signed)
Per Edd Fabian, FNP to fax over his notes. Fax # was not complete.   I will fax notes to fax# [phone]

## 2023-09-04 NOTE — Telephone Encounter (Signed)
 Preoperative team, please forward my note to the requesting office.  Fax number was not complete on surgical request form.  Thank you for your help.  Josefa HERO. Dieter Hane NP-C     09/04/2023, 1:05 PM [facility] [address] Office [phone] Fax [phone]

## 2023-09-04 NOTE — Telephone Encounter (Signed)
 Mitchell Villegas,  Mitchell Villegas 78 year old male is requesting preoperative cardiac evaluation for colonoscopy.  Procedure is scheduled for 09/17/2023.  He was recently seen by you in clinic.  He was doing well at that time.  Would you be able to comment on cardiac risk for his upcoming procedure?.  Thank you for your help.  Please direct your response to CV DIV preop pool.  Josefa HERO. Alphia Behanna NP-C     09/04/2023, 8:30 AM [facility] [address] Office [phone] Fax [phone]

## 2023-09-04 NOTE — Telephone Encounter (Signed)
   Pre-operative Risk Assessment    Patient Name: Mitchell Villegas  DOB: 13-Nov-1945 MRN: [id number]   Date of last office visit: 08/20/2023 Date of next office visit: NO APPT BUT HAVING ECHO 09/05/2023   Request for Surgical Clearance    Procedure:   COLONOSCOPY  Date of Surgery:  Clearance 09/17/23                                Surgeon:  DR. ELICIA Surgeon's Group or Practice Name:  [facility] Phone number:  [phone] Fax number:  [phone]   Type of Clearance Requested:   - Medical  - Pharmacy:  Hold Aspirin  NOT INDICATED   Type of Anesthesia:   PROPOFOL   Additional requests/questions:    SignedMemory Nest   09/04/2023, 7:22 AM

## 2023-09-05 ENCOUNTER — Ambulatory Visit (HOSPITAL_COMMUNITY): Payer: Medicare Other | Attending: Internal Medicine

## 2023-09-05 DIAGNOSIS — R002 Palpitations: Secondary | ICD-10-CM | POA: Diagnosis not present

## 2023-09-05 DIAGNOSIS — I351 Nonrheumatic aortic (valve) insufficiency: Secondary | ICD-10-CM | POA: Diagnosis not present

## 2023-09-05 DIAGNOSIS — E785 Hyperlipidemia, unspecified: Secondary | ICD-10-CM | POA: Insufficient documentation

## 2023-09-05 DIAGNOSIS — I080 Rheumatic disorders of both mitral and aortic valves: Secondary | ICD-10-CM | POA: Insufficient documentation

## 2023-09-05 DIAGNOSIS — I2510 Atherosclerotic heart disease of native coronary artery without angina pectoris: Secondary | ICD-10-CM | POA: Diagnosis not present

## 2023-09-05 DIAGNOSIS — I493 Ventricular premature depolarization: Secondary | ICD-10-CM | POA: Diagnosis not present

## 2023-09-05 LAB — ECHOCARDIOGRAM COMPLETE
AR max vel: 2.04 cm2
AV Area VTI: 1.92 cm2
AV Area mean vel: 1.94 cm2
AV Mean grad: 9 mm[Hg]
AV Peak grad: 16.6 mm[Hg]
Ao pk vel: 2.04 m/s
Area-P 1/2: 3.24 cm2
S' Lateral: 3 cm

## 2023-09-12 DIAGNOSIS — I493 Ventricular premature depolarization: Secondary | ICD-10-CM | POA: Diagnosis not present

## 2023-09-14 NOTE — Telephone Encounter (Signed)
Bring addressed in another MyChart message by provider

## 2023-09-17 ENCOUNTER — Other Ambulatory Visit: Payer: Self-pay | Admitting: *Deleted

## 2023-09-17 DIAGNOSIS — K625 Hemorrhage of anus and rectum: Secondary | ICD-10-CM | POA: Diagnosis not present

## 2023-09-17 DIAGNOSIS — K5730 Diverticulosis of large intestine without perforation or abscess without bleeding: Secondary | ICD-10-CM | POA: Diagnosis not present

## 2023-09-17 DIAGNOSIS — D125 Benign neoplasm of sigmoid colon: Secondary | ICD-10-CM | POA: Diagnosis not present

## 2023-09-17 DIAGNOSIS — K635 Polyp of colon: Secondary | ICD-10-CM | POA: Diagnosis not present

## 2023-09-17 DIAGNOSIS — K5289 Other specified noninfective gastroenteritis and colitis: Secondary | ICD-10-CM | POA: Diagnosis not present

## 2023-09-17 DIAGNOSIS — K51311 Ulcerative (chronic) rectosigmoiditis with rectal bleeding: Secondary | ICD-10-CM | POA: Diagnosis not present

## 2023-09-17 MED ORDER — METOPROLOL TARTRATE 25 MG PO TABS: 12.5000 mg | ORAL_TABLET | Freq: Two times a day (BID) | ORAL | 3 refills | Status: DC

## 2023-09-19 DIAGNOSIS — K635 Polyp of colon: Secondary | ICD-10-CM | POA: Diagnosis not present

## 2023-09-19 DIAGNOSIS — K5289 Other specified noninfective gastroenteritis and colitis: Secondary | ICD-10-CM | POA: Diagnosis not present

## 2023-09-19 DIAGNOSIS — D125 Benign neoplasm of sigmoid colon: Secondary | ICD-10-CM | POA: Diagnosis not present

## 2023-10-30 DIAGNOSIS — K5130 Ulcerative (chronic) rectosigmoiditis without complications: Secondary | ICD-10-CM | POA: Diagnosis not present

## 2023-10-30 DIAGNOSIS — Z860101 Personal history of adenomatous and serrated colon polyps: Secondary | ICD-10-CM | POA: Diagnosis not present

## 2023-11-01 DIAGNOSIS — K5130 Ulcerative (chronic) rectosigmoiditis without complications: Secondary | ICD-10-CM | POA: Diagnosis not present

## 2023-12-14 DIAGNOSIS — R7303 Prediabetes: Secondary | ICD-10-CM | POA: Diagnosis not present

## 2023-12-14 DIAGNOSIS — Z0000 Encounter for general adult medical examination without abnormal findings: Secondary | ICD-10-CM | POA: Diagnosis not present

## 2023-12-14 DIAGNOSIS — E782 Mixed hyperlipidemia: Secondary | ICD-10-CM | POA: Diagnosis not present

## 2023-12-14 DIAGNOSIS — Z125 Encounter for screening for malignant neoplasm of prostate: Secondary | ICD-10-CM | POA: Diagnosis not present

## 2023-12-29 DIAGNOSIS — E785 Hyperlipidemia, unspecified: Secondary | ICD-10-CM | POA: Diagnosis not present

## 2023-12-29 DIAGNOSIS — J439 Emphysema, unspecified: Secondary | ICD-10-CM | POA: Diagnosis not present

## 2023-12-29 DIAGNOSIS — E782 Mixed hyperlipidemia: Secondary | ICD-10-CM | POA: Diagnosis not present

## 2023-12-29 DIAGNOSIS — I2510 Atherosclerotic heart disease of native coronary artery without angina pectoris: Secondary | ICD-10-CM | POA: Diagnosis not present

## 2024-01-16 DIAGNOSIS — K5190 Ulcerative colitis, unspecified, without complications: Secondary | ICD-10-CM | POA: Diagnosis not present

## 2024-01-28 DIAGNOSIS — I2510 Atherosclerotic heart disease of native coronary artery without angina pectoris: Secondary | ICD-10-CM | POA: Diagnosis not present

## 2024-01-28 DIAGNOSIS — E782 Mixed hyperlipidemia: Secondary | ICD-10-CM | POA: Diagnosis not present

## 2024-01-28 DIAGNOSIS — J439 Emphysema, unspecified: Secondary | ICD-10-CM | POA: Diagnosis not present

## 2024-01-28 DIAGNOSIS — E785 Hyperlipidemia, unspecified: Secondary | ICD-10-CM | POA: Diagnosis not present

## 2024-02-19 DIAGNOSIS — H35033 Hypertensive retinopathy, bilateral: Secondary | ICD-10-CM | POA: Diagnosis not present

## 2024-02-19 DIAGNOSIS — H524 Presbyopia: Secondary | ICD-10-CM | POA: Diagnosis not present

## 2024-02-28 DIAGNOSIS — E782 Mixed hyperlipidemia: Secondary | ICD-10-CM | POA: Diagnosis not present

## 2024-02-28 DIAGNOSIS — I2510 Atherosclerotic heart disease of native coronary artery without angina pectoris: Secondary | ICD-10-CM | POA: Diagnosis not present

## 2024-02-28 DIAGNOSIS — E785 Hyperlipidemia, unspecified: Secondary | ICD-10-CM | POA: Diagnosis not present

## 2024-02-28 DIAGNOSIS — J439 Emphysema, unspecified: Secondary | ICD-10-CM | POA: Diagnosis not present

## 2024-03-05 DIAGNOSIS — L739 Follicular disorder, unspecified: Secondary | ICD-10-CM | POA: Diagnosis not present

## 2024-03-05 DIAGNOSIS — D225 Melanocytic nevi of trunk: Secondary | ICD-10-CM | POA: Diagnosis not present

## 2024-03-05 DIAGNOSIS — L814 Other melanin hyperpigmentation: Secondary | ICD-10-CM | POA: Diagnosis not present

## 2024-03-05 DIAGNOSIS — L821 Other seborrheic keratosis: Secondary | ICD-10-CM | POA: Diagnosis not present

## 2024-03-10 DIAGNOSIS — E785 Hyperlipidemia, unspecified: Secondary | ICD-10-CM | POA: Diagnosis not present

## 2024-03-10 DIAGNOSIS — R7303 Prediabetes: Secondary | ICD-10-CM | POA: Diagnosis not present

## 2024-03-30 DIAGNOSIS — I2510 Atherosclerotic heart disease of native coronary artery without angina pectoris: Secondary | ICD-10-CM | POA: Diagnosis not present

## 2024-03-30 DIAGNOSIS — E785 Hyperlipidemia, unspecified: Secondary | ICD-10-CM | POA: Diagnosis not present

## 2024-03-30 DIAGNOSIS — E782 Mixed hyperlipidemia: Secondary | ICD-10-CM | POA: Diagnosis not present

## 2024-03-30 DIAGNOSIS — J439 Emphysema, unspecified: Secondary | ICD-10-CM | POA: Diagnosis not present

## 2024-04-25 DIAGNOSIS — H35363 Drusen (degenerative) of macula, bilateral: Secondary | ICD-10-CM | POA: Diagnosis not present

## 2024-04-29 DIAGNOSIS — J439 Emphysema, unspecified: Secondary | ICD-10-CM | POA: Diagnosis not present

## 2024-04-29 DIAGNOSIS — E785 Hyperlipidemia, unspecified: Secondary | ICD-10-CM | POA: Diagnosis not present

## 2024-04-29 DIAGNOSIS — I2510 Atherosclerotic heart disease of native coronary artery without angina pectoris: Secondary | ICD-10-CM | POA: Diagnosis not present

## 2024-04-29 DIAGNOSIS — E782 Mixed hyperlipidemia: Secondary | ICD-10-CM | POA: Diagnosis not present

## 2024-05-21 DIAGNOSIS — R7303 Prediabetes: Secondary | ICD-10-CM | POA: Diagnosis not present

## 2024-05-21 DIAGNOSIS — E782 Mixed hyperlipidemia: Secondary | ICD-10-CM | POA: Diagnosis not present

## 2024-05-21 DIAGNOSIS — Z79899 Other long term (current) drug therapy: Secondary | ICD-10-CM | POA: Diagnosis not present

## 2024-05-30 DIAGNOSIS — I2510 Atherosclerotic heart disease of native coronary artery without angina pectoris: Secondary | ICD-10-CM | POA: Diagnosis not present

## 2024-05-30 DIAGNOSIS — E785 Hyperlipidemia, unspecified: Secondary | ICD-10-CM | POA: Diagnosis not present

## 2024-05-30 DIAGNOSIS — E782 Mixed hyperlipidemia: Secondary | ICD-10-CM | POA: Diagnosis not present

## 2024-05-30 DIAGNOSIS — J439 Emphysema, unspecified: Secondary | ICD-10-CM | POA: Diagnosis not present

## 2024-06-16 ENCOUNTER — Other Ambulatory Visit: Payer: Self-pay | Admitting: Family Medicine

## 2024-06-16 DIAGNOSIS — R918 Other nonspecific abnormal finding of lung field: Secondary | ICD-10-CM

## 2024-06-16 DIAGNOSIS — J439 Emphysema, unspecified: Secondary | ICD-10-CM

## 2024-06-20 ENCOUNTER — Ambulatory Visit
Admission: RE | Admit: 2024-06-20 | Discharge: 2024-06-20 | Disposition: A | Discharge status: Home / Self Care | Source: Ambulatory Visit | Attending: Family Medicine | Admitting: Family Medicine

## 2024-06-20 DIAGNOSIS — J439 Emphysema, unspecified: Secondary | ICD-10-CM

## 2024-06-20 DIAGNOSIS — R918 Other nonspecific abnormal finding of lung field: Secondary | ICD-10-CM

## 2024-06-29 DIAGNOSIS — J439 Emphysema, unspecified: Secondary | ICD-10-CM | POA: Diagnosis not present

## 2024-06-29 DIAGNOSIS — E782 Mixed hyperlipidemia: Secondary | ICD-10-CM | POA: Diagnosis not present

## 2024-06-29 DIAGNOSIS — I2510 Atherosclerotic heart disease of native coronary artery without angina pectoris: Secondary | ICD-10-CM | POA: Diagnosis not present

## 2024-06-29 DIAGNOSIS — E785 Hyperlipidemia, unspecified: Secondary | ICD-10-CM | POA: Diagnosis not present

## 2024-07-07 DIAGNOSIS — K5130 Ulcerative (chronic) rectosigmoiditis without complications: Secondary | ICD-10-CM | POA: Diagnosis not present

## 2024-07-09 DIAGNOSIS — K5130 Ulcerative (chronic) rectosigmoiditis without complications: Secondary | ICD-10-CM | POA: Diagnosis not present

## 2024-09-03 ENCOUNTER — Ambulatory Visit: Admitting: Cardiovascular Disease

## 2024-09-03 VITALS — BP 146/74 | HR 64 | Ht 74.0 in | Wt 200.0 lb

## 2024-09-03 DIAGNOSIS — I340 Nonrheumatic mitral (valve) insufficiency: Secondary | ICD-10-CM | POA: Diagnosis not present

## 2024-09-03 DIAGNOSIS — I2510 Atherosclerotic heart disease of native coronary artery without angina pectoris: Secondary | ICD-10-CM

## 2024-09-03 DIAGNOSIS — I493 Ventricular premature depolarization: Secondary | ICD-10-CM

## 2024-09-03 MED ORDER — METOPROLOL SUCCINATE ER 25 MG PO TB24: 25.0000 mg | ORAL_TABLET | Freq: Every day | ORAL | 3 refills | Status: AC | PRN

## 2024-09-03 NOTE — Progress Notes (Signed)
 "   Chief Complaint  Patient presents with   Follow-up    CAD   History of Present Illness: 79 yo male with history of tobacco abuse, CAD, mitral regurgitation, hyperlipidemia and ulcerative colitis who is here today for follow up. Stress test in 2017 with inferolateral scar with no ischemia. Echo in 2017 with LVEF=55-60% and no valve disease. He had a chest CT without contrast for lung cancer screening in 2023 and was found to have diffuse coronary calcification in the left main and all three major epicardial vessels. Cardiac cath in November 2023 with mild CAD. Echo February 2025 with LVEF=50-55%. Mild mitral regurgitation. Cardiac monitor February 2025 with sinus, 11 runs of SVT (longest 14 beats), Rare PACs, PVCs. Mobitz 1 AV block.  He was diagnosed with ulcerative colitis in 2025.   He is here today for follow up. The patient denies any chest pain, dyspnea, palpitations, lower extremity edema, orthopnea, PND, dizziness, near syncope or syncope.    Primary Care Physician: Frederik Charleston, MD   Past Medical History:  Diagnosis Date   Aortic atherosclerosis    CAD (coronary artery disease)    Contact lens/glasses fitting    Hernia    Hyperlipidemia     Past Surgical History:  Procedure Laterality Date   arm surgery     Stitches for wound (cut) two places. 8 to 10 years ago.   HERNIA REPAIR  04/10/2011   bilateral inguinal   LEFT HEART CATH AND CORONARY ANGIOGRAPHY N/A 06/29/2022   Procedure: LEFT HEART CATH AND CORONARY ANGIOGRAPHY;  Surgeon: Verlin Lonni BIRCH, MD;  Location: [facility] INVASIVE CV LAB;  Service: Cardiovascular;  Laterality: N/A;   MOLE REMOVAL     about 12 years ago   NASAL SINUS SURGERY      Current Outpatient Medications  Medication Sig Dispense Refill   acetaminophen  (TYLENOL ) 500 MG tablet Take 500 mg by mouth every 6 (six) hours as needed for moderate pain.     aspirin  81 MG tablet Take 81 mg by mouth daily.      atorvastatin (LIPITOR) 40 MG tablet Take  40 mg by mouth daily.       fluticasone (FLONASE) 50 MCG/ACT nasal spray Place 2 sprays into both nostrils daily.     mesalamine (CANASA) 1000 MG suppository      mesalamine (LIALDA) 1.2 g EC tablet      metoprolol  succinate (TOPROL -XL) 25 MG 24 hr tablet Take 1 tablet (25 mg total) by mouth daily as needed. 90 tablet 3   Multiple Vitamin (MULTI-VITAMIN PO) Take 1 tablet by mouth daily.     No current facility-administered medications for this visit.    Allergies  Allergen Reactions   Penicillins Anaphylaxis and Hives    Social History   Socioeconomic History   Marital status: Married    Spouse name: Not on file   Number of children: 2   Years of education: Not on file   Highest education level: Some college, no degree  Occupational History   Occupation: Arboriculturist  Tobacco Use   Smoking status: Former    Current packs/day: 0.00    Average packs/day: 1 pack/day for 60.0 years (60.0 ttl pk-yrs)    Types: Cigarettes    Start date: 06/01/1962    Quit date: 06/01/2022    Years since quitting: 2.2   Smokeless tobacco: Never  Substance and Sexual Activity   Alcohol use: Yes    Comment: 1 or 2 beers per month  Drug use: No   Sexual activity: Not on file  Other Topics Concern   Not on file  Social History Narrative   Not on file   Social Drivers of Health   Tobacco Use: Medium Risk (08/27/2024)   Patient History    Smoking Tobacco Use: Former    Smokeless Tobacco Use: Never    Passive Exposure: Not on file  Financial Resource Strain: Low Risk (08/27/2024)   Overall Financial Resource Strain (CARDIA)    Difficulty of Paying Living Expenses: Not hard at all  Food Insecurity: No Food Insecurity (08/27/2024)   Epic    Worried About Radiation Protection Practitioner of Food in the Last Year: Never true    Ran Out of Food in the Last Year: Never true  Transportation Needs: No Transportation Needs (08/27/2024)   Epic    Lack of Transportation (Medical): No    Lack of Transportation  (Non-Medical): No  Physical Activity: Insufficiently Active (08/27/2024)   Exercise Vital Sign    Days of Exercise per Week: 3 days    Minutes of Exercise per Session: 30 min  Stress: Stress Concern Present (08/27/2024)   [employer] of Occupational Health - Occupational Stress Questionnaire    Feeling of Stress: To some extent  Social Connections: Socially Integrated (08/27/2024)   Social Connection and Isolation Panel    Frequency of Communication with Friends and Family: Twice a week    Frequency of Social Gatherings with Friends and Family: Three times a week    Attends Religious Services: More than 4 times per year    Active Member of Clubs or Organizations: Yes    Attends Banker Meetings: More than 4 times per year    Marital Status: Married  Catering Manager Violence: Not on file  Depression (EYV7-0): Not on file  Alcohol Screen: Low Risk (08/27/2024)   Alcohol Screen    Last Alcohol Screening Score (AUDIT): 1  Housing: Low Risk (08/27/2024)   Epic    Unable to Pay for Housing in the Last Year: No    Number of Times Moved in the Last Year: 0    Homeless in the Last Year: No  Utilities: Not on file  Health Literacy: Not on file    Family History  Problem Relation Age of Onset   Diabetes Mother    Heart disease Father 46       heart attack in his 24s   Heart disease Brother        heart attack    Review of Systems:  As stated in the HPI and otherwise negative.   BP (!) 146/74 (BP Location: Right Arm, Patient Position: Sitting, Cuff Size: Normal)   Pulse 64   Ht 6' 2 (1.88 m)   Wt 200 lb (90.7 kg)   SpO2 95%   BMI 25.68 kg/m   Physical Examination: General: Well developed, well nourished, NAD  SKIN: warm, dry. Neuro: No focal deficits  Psychiatric: Mood and affect normal  Neck: No JVD Lungs:Clear bilaterally, no wheezes, rhonci, crackles Cardiovascular: Regular rate and rhythm. No murmurs, gallops or rubs. Abdomen:Soft.  Extremities: No  lower extremity edema.    EKG:  EKG is ordered today. The ekg ordered today demonstrates  EKG Interpretation Date/Time:  Wednesday September 03 2024 15:35:42 EST Ventricular Rate:  63 PR Interval:  188 QRS Duration:  88 QT Interval:  406 QTC Calculation: 415 R Axis:   42  Text Interpretation: Normal sinus rhythm Normal ECG Confirmed by Verlin Bruckner (  [id number]) on 09/03/2024 3:37:42 PM    Recent Labs: No results found for requested labs within last 365 days.   Lipid Panel No results found for: CHOL, TRIG, HDL, CHOLHDL, VLDL, LDLCALC, LDLDIRECT   Wt Readings from Last 3 Encounters:  09/03/24 200 lb (90.7 kg)  08/21/23 194 lb (88 kg)  08/20/23 199 lb (90.3 kg)    Assessment and Plan:   1. CAD with angina: Cardiac cath in 2023 with mild CAD. No chest pain. EKG is normal today -Continue ASA, Lipitor  2. SVT/PACs/PVCs: No palpitations.  -Will take Toprol  as needed.  -Toprol  is replacing Lopressor   3. Mitral regurgitation: Mild by echo in February 2025.   4. Tobacco abuse: He stopped smoking in 2023.   Labs/ tests ordered today include:  Orders Placed This Encounter  Procedures   EKG 12-Lead   Disposition:   F/U with me in one year.   Signed, Lonni Cash, MD, [facility] 09/03/2024 4:15 PM    [facility] [address] [place], [place], [place]  [address] Phone: [phone]; Fax: [phone]    "

## 2024-09-03 NOTE — Patient Instructions (Signed)
 Medication Instructions:  Please discontinue your Metoprolol  Tartrate and take Metoprolol  Succainte 25 mg once a day as needed. Continue all other medications as listed.  *If you need a refill on your cardiac medications before your next appointment, please call your pharmacy*  Follow-Up: At [facility], you and your health needs are our priority.  As part of our continuing mission to provide you with exceptional heart care, our providers are all part of one team.  This team includes your primary Cardiologist (physician) and Advanced Practice Providers or APPs (Physician Assistants and Nurse Practitioners) who all work together to provide you with the care you need, when you need it.  Your next appointment:   1 year(s)  Provider:   Lonni Cash, MD    We recommend signing up for the patient portal called MyChart.  Sign up information is provided on this After Visit Summary.  MyChart is used to connect with patients for Virtual Visits (Telemedicine).  Patients are able to view lab/test results, encounter notes, upcoming appointments, etc.  Non-urgent messages can be sent to your provider as well.   To learn more about what you can do with MyChart, go to [email or web address].
